# Patient Record
Sex: Male | Born: 1990 | Hispanic: No | Smoking: Current every day smoker
Health system: Southern US, Community
[De-identification: ages and names within clinical notes are randomized; demographics above are authoritative.]

## PROBLEM LIST (undated history)

## (undated) DIAGNOSIS — F1910 Other psychoactive substance abuse, uncomplicated: Secondary | ICD-10-CM

## (undated) DIAGNOSIS — B20 Human immunodeficiency virus [HIV] disease: Secondary | ICD-10-CM

## (undated) DIAGNOSIS — F419 Anxiety disorder, unspecified: Secondary | ICD-10-CM

## (undated) DIAGNOSIS — A529 Late syphilis, unspecified: Secondary | ICD-10-CM

## (undated) DIAGNOSIS — Z21 Asymptomatic human immunodeficiency virus [HIV] infection status: Secondary | ICD-10-CM

## (undated) DIAGNOSIS — F329 Major depressive disorder, single episode, unspecified: Secondary | ICD-10-CM

## (undated) DIAGNOSIS — T8859XA Other complications of anesthesia, initial encounter: Secondary | ICD-10-CM

## (undated) DIAGNOSIS — F32A Depression, unspecified: Secondary | ICD-10-CM

## (undated) DIAGNOSIS — T7840XA Allergy, unspecified, initial encounter: Secondary | ICD-10-CM

## (undated) HISTORY — DX: Human immunodeficiency virus (HIV) disease: B20

## (undated) HISTORY — DX: Allergy, unspecified, initial encounter: T78.40XA

## (undated) HISTORY — PX: WISDOM TOOTH EXTRACTION: SHX21

## (undated) HISTORY — DX: Asymptomatic human immunodeficiency virus (hiv) infection status: Z21

---
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
## 2011-01-03 ENCOUNTER — Emergency Department (HOSPITAL_COMMUNITY)
Admission: EM | Admit: 2011-01-03 | Discharge: 2011-01-03 | Disposition: A | Discharge status: Home / Self Care | Payer: Self-pay | Attending: Emergency Medicine | Admitting: Emergency Medicine

## 2011-01-03 ENCOUNTER — Emergency Department (HOSPITAL_COMMUNITY): Payer: Self-pay

## 2011-01-03 DIAGNOSIS — F411 Generalized anxiety disorder: Secondary | ICD-10-CM | POA: Insufficient documentation

## 2011-01-03 DIAGNOSIS — R042 Hemoptysis: Secondary | ICD-10-CM | POA: Insufficient documentation

## 2011-01-03 DIAGNOSIS — R079 Chest pain, unspecified: Secondary | ICD-10-CM | POA: Insufficient documentation

## 2011-01-07 ENCOUNTER — Emergency Department (HOSPITAL_COMMUNITY)
Admission: EM | Admit: 2011-01-07 | Discharge: 2011-01-07 | Disposition: A | Discharge status: Home / Self Care | Payer: Self-pay | Attending: Emergency Medicine | Admitting: Emergency Medicine

## 2011-01-07 ENCOUNTER — Emergency Department (HOSPITAL_COMMUNITY): Payer: Self-pay

## 2011-01-07 DIAGNOSIS — X500XXA Overexertion from strenuous movement or load, initial encounter: Secondary | ICD-10-CM | POA: Insufficient documentation

## 2011-01-07 DIAGNOSIS — S93409A Sprain of unspecified ligament of unspecified ankle, initial encounter: Secondary | ICD-10-CM | POA: Insufficient documentation

## 2011-01-07 DIAGNOSIS — R229 Localized swelling, mass and lump, unspecified: Secondary | ICD-10-CM | POA: Insufficient documentation

## 2011-01-15 ENCOUNTER — Emergency Department (HOSPITAL_COMMUNITY)
Admission: EM | Admit: 2011-01-15 | Discharge: 2011-01-15 | Disposition: A | Discharge status: Home / Self Care | Payer: Self-pay | Attending: Emergency Medicine | Admitting: Emergency Medicine

## 2011-01-15 DIAGNOSIS — M25579 Pain in unspecified ankle and joints of unspecified foot: Secondary | ICD-10-CM | POA: Insufficient documentation

## 2011-01-21 LAB — RAPID STREP SCREEN (MED CTR MEBANE ONLY): Streptococcus, Group A Screen (Direct): NEGATIVE

## 2011-01-21 LAB — MONONUCLEOSIS SCREEN: Mono Screen: NEGATIVE

## 2011-03-07 ENCOUNTER — Emergency Department (HOSPITAL_COMMUNITY): Payer: Self-pay

## 2011-03-07 ENCOUNTER — Encounter: Payer: Self-pay | Admitting: *Deleted

## 2011-03-07 ENCOUNTER — Emergency Department (HOSPITAL_COMMUNITY)
Admission: EM | Admit: 2011-03-07 | Discharge: 2011-03-07 | Disposition: A | Discharge status: Home / Self Care | Payer: Self-pay | Attending: Emergency Medicine | Admitting: Emergency Medicine

## 2011-03-07 DIAGNOSIS — K529 Noninfective gastroenteritis and colitis, unspecified: Secondary | ICD-10-CM

## 2011-03-07 DIAGNOSIS — J111 Influenza due to unidentified influenza virus with other respiratory manifestations: Secondary | ICD-10-CM | POA: Insufficient documentation

## 2011-03-07 DIAGNOSIS — J069 Acute upper respiratory infection, unspecified: Secondary | ICD-10-CM

## 2011-03-07 DIAGNOSIS — K5289 Other specified noninfective gastroenteritis and colitis: Secondary | ICD-10-CM | POA: Insufficient documentation

## 2011-03-07 LAB — DIFFERENTIAL
Basophils Absolute: 0 10*3/uL (ref 0.0–0.1)
Basophils Relative: 0 % (ref 0–1)
Eosinophils Absolute: 0 10*3/uL (ref 0.0–0.7)
Eosinophils Relative: 0 % (ref 0–5)
Lymphocytes Relative: 4 % — ABNORMAL LOW (ref 12–46)
Lymphs Abs: 1 10*3/uL (ref 0.7–4.0)
Monocytes Absolute: 1.9 10*3/uL — ABNORMAL HIGH (ref 0.1–1.0)
Monocytes Relative: 8 % (ref 3–12)
Neutro Abs: 20.7 10*3/uL — ABNORMAL HIGH (ref 1.7–7.7)
Neutrophils Relative %: 87 % — ABNORMAL HIGH (ref 43–77)

## 2011-03-07 LAB — CBC
HCT: 41.4 % (ref 39.0–52.0)
Hemoglobin: 14.2 g/dL (ref 13.0–17.0)
MCH: 29.8 pg (ref 26.0–34.0)
MCHC: 34.3 g/dL (ref 30.0–36.0)
MCV: 87 fL (ref 78.0–100.0)
Platelets: 273 10*3/uL (ref 150–400)
RBC: 4.76 MIL/uL (ref 4.22–5.81)
RDW: 13.8 % (ref 11.5–15.5)
WBC: 23.7 10*3/uL — ABNORMAL HIGH (ref 4.0–10.5)

## 2011-03-07 LAB — URINALYSIS, ROUTINE W REFLEX MICROSCOPIC
Bilirubin Urine: NEGATIVE
Glucose, UA: NEGATIVE mg/dL
Hgb urine dipstick: NEGATIVE
Ketones, ur: NEGATIVE mg/dL
Leukocytes, UA: NEGATIVE
Nitrite: NEGATIVE
Protein, ur: NEGATIVE mg/dL
Specific Gravity, Urine: 1.029 (ref 1.005–1.030)
Urobilinogen, UA: 0.2 mg/dL (ref 0.0–1.0)
pH: 5.5 (ref 5.0–8.0)

## 2011-03-07 LAB — COMPREHENSIVE METABOLIC PANEL
ALT: 64 U/L — ABNORMAL HIGH (ref 0–53)
AST: 38 U/L — ABNORMAL HIGH (ref 0–37)
Albumin: 4.2 g/dL (ref 3.5–5.2)
Alkaline Phosphatase: 54 U/L (ref 39–117)
BUN: 13 mg/dL (ref 6–23)
CO2: 22 mEq/L (ref 19–32)
Calcium: 10.6 mg/dL — ABNORMAL HIGH (ref 8.4–10.5)
Chloride: 99 mEq/L (ref 96–112)
Creatinine, Ser: 1 mg/dL (ref 0.50–1.35)
GFR calc Af Amer: >90 mL/min (ref >90)
GFR calc non Af Amer: >90 mL/min (ref >90)
Glucose, Bld: 133 mg/dL — ABNORMAL HIGH (ref 70–99)
Potassium: 3.5 mEq/L (ref 3.5–5.1)
Sodium: 135 mEq/L (ref 135–145)
Total Bilirubin: 0.6 mg/dL (ref 0.3–1.2)
Total Protein: 7.7 g/dL (ref 6.0–8.3)

## 2011-03-07 MED ORDER — SODIUM CHLORIDE 0.9 % IV SOLN
Freq: Once | INTRAVENOUS | Status: AC
  Administered 2011-03-07: 14:00:00 via INTRAVENOUS

## 2011-03-07 MED ORDER — KETOROLAC TROMETHAMINE 30 MG/ML IJ SOLN
30.0000 mg | Freq: Once | INTRAMUSCULAR | Status: AC
  Administered 2011-03-07: 30 mg via INTRAVENOUS
  Filled 2011-03-07: qty 1

## 2011-03-07 MED ORDER — ONDANSETRON HCL 4 MG/2ML IJ SOLN
4.0000 mg | Freq: Once | INTRAMUSCULAR | Status: AC
  Administered 2011-03-07: 4 mg via INTRAVENOUS
  Filled 2011-03-07: qty 2

## 2011-03-07 MED ORDER — HYDROCODONE-ACETAMINOPHEN 5-325 MG PO TABS: 1.0000 | ORAL_TABLET | Freq: Four times a day (QID) | ORAL | Status: AC | PRN

## 2011-03-07 MED ORDER — PROMETHAZINE HCL 25 MG RE SUPP: 25.0000 mg | Freq: Four times a day (QID) | RECTAL | Status: DC | PRN

## 2011-03-07 MED ORDER — PROMETHAZINE-DM 6.25-15 MG/5ML PO SYRP: 5.0000 mL | ORAL_SOLUTION | Freq: Four times a day (QID) | ORAL | Status: AC | PRN

## 2011-03-07 MED ORDER — SODIUM CHLORIDE 0.9 % IV BOLUS (SEPSIS)
1000.0000 mL | Freq: Once | INTRAVENOUS | Status: AC
  Administered 2011-03-07: 1000 mL via INTRAVENOUS

## 2011-03-07 MED ORDER — MORPHINE SULFATE 4 MG/ML IJ SOLN
6.0000 mg | Freq: Once | INTRAMUSCULAR | Status: AC
  Administered 2011-03-07: 6 mg via INTRAVENOUS
  Filled 2011-03-07 (×2): qty 1

## 2011-03-07 NOTE — ED Notes (Signed)
Pt. Given Ginger Ale for PO challenge per PA order. Additional fluids hung.

## 2011-03-07 NOTE — ED Notes (Signed)
Pt still unable to urinate at this time, pt and family was reminded of the need

## 2011-03-07 NOTE — ED Notes (Signed)
Pt is aware of the need for a urine sample, urinal provided at bedside

## 2011-03-07 NOTE — ED Provider Notes (Signed)
History     CSN: [id number] Arrival date & time: 03/07/2011 11:59 AM   First MD Initiated Contact with Patient 03/07/11 1232      Chief Complaint  Patient presents with  . Nausea  . Emesis    (Consider location/radiation/quality/duration/timing/severity/associated sxs/prior treatment) Patient is a 20 y.o. male presenting with vomiting. The history is provided by the patient.  Emesis    Patient presents with a one-day history of nausea, vomiting, body aches, nasal congestion, cough, and runny nose.  He denies chest pain, abdominal pain, shortness of breath, weakness, numbness, blurred vision.  Patient is unable to keep fluids and ibuprofen down.     History reviewed. No pertinent past surgical history.  History reviewed. No pertinent family history.  History  Substance Use Topics  . Smoking status: Never Smoker   . Smokeless tobacco: Never Used  . Alcohol Use: No      Review of Systems  Gastrointestinal: Positive for vomiting.  All other systems reviewed and are negative.    Allergies  Review of patient's allergies indicates no known allergies.  Home Medications   Current Outpatient Rx  Name Route Sig Dispense Refill  . CLONAZEPAM 1 MG PO TABS Oral Take 1 mg by mouth 2 (two) times daily as needed. For anxiety panic attack     . PAROXETINE HCL 20 MG PO TABS Oral Take 20 mg by mouth every morning.        BP 114/85  Pulse 115  Temp(Src) 100.4 F (38 C) (Oral)  Resp 18  SpO2 100%  Physical Exam  Constitutional: He is oriented to person, place, and time. He appears well-developed and well-nourished. He appears distressed.  HENT:  Head: Normocephalic and atraumatic.  Eyes: Pupils are equal, round, and reactive to light.  Neck: Normal range of motion. Neck supple.  Cardiovascular: Normal rate and regular rhythm.   Pulmonary/Chest: Effort normal and breath sounds normal.  Abdominal: Soft. Bowel sounds are normal. He exhibits no distension. There is  tenderness. There is no rebound and no guarding.  Neurological: He is alert and oriented to person, place, and time.  Skin: Skin is warm and dry. No rash noted.  Psychiatric: He has a normal mood and affect. His behavior is normal. Judgment and thought content normal.    ED Course  Procedures (including critical care time)  Labs Reviewed  CBC - Abnormal; Notable for the following:    WBC 23.7 (*)    All other components within normal limits  DIFFERENTIAL - Abnormal; Notable for the following:    Neutrophils Relative 87 (*)    Neutro Abs 20.7 (*)    Lymphocytes Relative 4 (*)    Monocytes Absolute 1.9 (*)    All other components within normal limits  URINALYSIS, ROUTINE W REFLEX MICROSCOPIC  COMPREHENSIVE METABOLIC PANEL      Results for orders placed during the hospital encounter of 03/07/11  URINALYSIS, ROUTINE W REFLEX MICROSCOPIC      Component Value Range   Color, Urine YELLOW  YELLOW    Appearance CLOUDY (*) CLEAR    Specific Gravity, Urine 1.029  1.005 - 1.030    pH 5.5  5.0 - 8.0    Glucose, UA NEGATIVE  NEGATIVE (mg/dL)   Hgb urine dipstick NEGATIVE  NEGATIVE    Bilirubin Urine NEGATIVE  NEGATIVE    Ketones, ur NEGATIVE  NEGATIVE (mg/dL)   Protein, ur NEGATIVE  NEGATIVE (mg/dL)   Urobilinogen, UA 0.2  0.0 - 1.0 (mg/dL)  Nitrite NEGATIVE  NEGATIVE    Leukocytes, UA NEGATIVE  NEGATIVE   CBC      Component Value Range   WBC 23.7 (*) 4.0 - 10.5 (K/uL)   RBC 4.76  4.22 - 5.81 (MIL/uL)   Hemoglobin 14.2  13.0 - 17.0 (g/dL)   HCT 16.1  09.6 - 04.5 (%)   MCV 87.0  78.0 - 100.0 (fL)   MCH 29.8  26.0 - 34.0 (pg)   MCHC 34.3  30.0 - 36.0 (g/dL)   RDW 40.9  81.1 - 91.4 (%)   Platelets 273  150 - 400 (K/uL)  DIFFERENTIAL      Component Value Range   Neutrophils Relative 87 (*) 43 - 77 (%)   Neutro Abs 20.7 (*) 1.7 - 7.7 (K/uL)   Lymphocytes Relative 4 (*) 12 - 46 (%)   Lymphs Abs 1.0  0.7 - 4.0 (K/uL)   Monocytes Relative 8  3 - 12 (%)   Monocytes Absolute 1.9  (*) 0.1 - 1.0 (K/uL)   Eosinophils Relative 0  0 - 5 (%)   Eosinophils Absolute 0.0  0.0 - 0.7 (K/uL)   Basophils Relative 0  0 - 1 (%)   Basophils Absolute 0.0  0.0 - 0.1 (K/uL)  COMPREHENSIVE METABOLIC PANEL      Component Value Range   Sodium 135  135 - 145 (mEq/L)   Potassium 3.5  3.5 - 5.1 (mEq/L)   Chloride 99  96 - 112 (mEq/L)   CO2 22  19 - 32 (mEq/L)   Glucose, Bld 133 (*) 70 - 99 (mg/dL)   BUN 13  6 - 23 (mg/dL)   Creatinine, Ser 7.82  0.50 - 1.35 (mg/dL)   Calcium 95.6 (*) 8.4 - 10.5 (mg/dL)   Total Protein 7.7  6.0 - 8.3 (g/dL)   Albumin 4.2  3.5 - 5.2 (g/dL)   AST 38 (*) 0 - 37 (U/L)   ALT 64 (*) 0 - 53 (U/L)   Alkaline Phosphatase 54  39 - 117 (U/L)   Total Bilirubin 0.6  0.3 - 1.2 (mg/dL)   GFR calc non Af Amer >90  >90 (mL/min)   GFR calc Af Amer >90  >90 (mL/min)    Patient most likely has a viral illness.  He has been stable while here in the emergency department.  He has gotten 3 L of fluids and medications and is feeling dramatically better.  We will have him recheck with his primary care Dr. for followup.  Told to return here for any worsening in his condition.  MDM  Patient will be treated for a viral flulike illness. He is told to increase his fluids.  Rest as much as well as.  Patient is dramatically improved and states that he has feeling much better.         Carlyle Dolly, [place] 03/07/11 1807

## 2011-03-07 NOTE — ED Notes (Signed)
Pt states he is having n/v/s. Pt also c/o chillis and fever. Pt states he is having body aches

## 2011-03-07 NOTE — ED Notes (Signed)
Pt again reminded of the need for urine.

## 2011-03-07 NOTE — ED Notes (Signed)
Patient transported to X-ray 

## 2011-03-09 NOTE — ED Provider Notes (Signed)
Medical screening examination/treatment/procedure(s) were performed by non-physician practitioner and as supervising physician I was immediately available for consultation/collaboration.  Toy Baker, MD 03/09/11 [phone]

## 2012-09-02 ENCOUNTER — Emergency Department (HOSPITAL_COMMUNITY)
Admission: EM | Admit: 2012-09-02 | Discharge: 2012-09-02 | Disposition: A | Discharge status: Home / Self Care | Payer: Self-pay | Attending: Emergency Medicine | Admitting: Emergency Medicine

## 2012-09-02 ENCOUNTER — Encounter (HOSPITAL_COMMUNITY): Payer: Self-pay

## 2012-09-02 DIAGNOSIS — Z79899 Other long term (current) drug therapy: Secondary | ICD-10-CM | POA: Insufficient documentation

## 2012-09-02 DIAGNOSIS — L089 Local infection of the skin and subcutaneous tissue, unspecified: Secondary | ICD-10-CM

## 2012-09-02 DIAGNOSIS — L723 Sebaceous cyst: Secondary | ICD-10-CM | POA: Insufficient documentation

## 2012-09-02 MED ORDER — OXYCODONE-ACETAMINOPHEN 5-325 MG PO TABS
2.0000 | ORAL_TABLET | Freq: Once | ORAL | Status: AC
  Administered 2012-09-02: 2 via ORAL
  Filled 2012-09-02: qty 2

## 2012-09-02 MED ORDER — IBUPROFEN 200 MG PO TABS
600.0000 mg | ORAL_TABLET | Freq: Once | ORAL | Status: AC
  Administered 2012-09-02: 600 mg via ORAL
  Filled 2012-09-02: qty 3

## 2012-09-02 MED ORDER — LORAZEPAM 1 MG PO TABS
1.0000 mg | ORAL_TABLET | Freq: Once | ORAL | Status: AC
  Administered 2012-09-02: 1 mg via ORAL
  Filled 2012-09-02: qty 1

## 2012-09-02 MED ORDER — TRAMADOL HCL 50 MG PO TABS: 50.0000 mg | ORAL_TABLET | Freq: Four times a day (QID) | ORAL | Status: DC | PRN

## 2012-09-02 MED ORDER — LIDOCAINE-EPINEPHRINE 2 %-1:100000 IJ SOLN: 10.0000 mL | Freq: Once | INTRAMUSCULAR | Status: DC

## 2012-09-02 NOTE — ED Notes (Signed)
Pt presents with an abscess on his lower abdominal area right above his pubic bone that started out as knot about a year ago and has just gotten progressively worse.

## 2012-09-02 NOTE — ED Provider Notes (Addendum)
History    22 year old male with a "knot" in his left lower abodmen. Patient states that this is been there for a period of several years. Increasingly enlarged in size and painful over the past several days. Denies any acute trauma. No surrounding rash. No fevers or chills. No drainage. No urinary complaints. No nausea or vomiting.  CSN: [id number]  Arrival date & time 09/02/12  1222   First MD Initiated Contact with Patient 09/02/12 1234      Chief Complaint  Patient presents with  . Abscess    (Consider location/radiation/quality/duration/timing/severity/associated sxs/prior treatment) HPI  History reviewed. No pertinent past medical history.  Past Surgical History  Procedure Laterality Date  . Wisdom tooth extraction      No family history on file.  History  Substance Use Topics  . Smoking status: Never Smoker   . Smokeless tobacco: Never Used  . Alcohol Use: No      Review of Systems  All systems reviewed and negative, other than as noted in HPI.   Allergies  Review of patient's allergies indicates no known allergies.  Home Medications   Current Outpatient Rx  Name  Route  Sig  Dispense  Refill  . clonazePAM (KLONOPIN) 1 MG tablet   Oral   Take 1 mg by mouth 2 (two) times daily as needed. For anxiety panic attack          . ibuprofen (ADVIL,MOTRIN) 200 MG tablet   Oral   Take 400 mg by mouth every 6 (six) hours as needed for pain.         [name] PARoxetine (PAXIL) 20 MG tablet   Oral   Take 20 mg by mouth every morning.             BP 154/72  Pulse 103  Temp(Src) 98.7 F (37.1 C) (Oral)  Resp 18  SpO2 100%  Physical Exam  Nursing note and vitals reviewed. Constitutional: He appears well-developed and well-nourished. No distress.  HENT:  Head: Normocephalic and atraumatic.  Eyes: Conjunctivae are normal. Right eye exhibits no discharge. Left eye exhibits no discharge.  Neck: Neck supple.  Cardiovascular: Normal rate, regular rhythm and  normal heart sounds.  Exam reveals no gallop and no friction rub.   No murmur heard. Pulmonary/Chest: Effort normal and breath sounds normal. No respiratory distress.  Abdominal: Soft. He exhibits no distension. There is no tenderness.  Musculoskeletal: He exhibits no edema and no tenderness.  Neurological: He is alert.  Skin: Skin is warm and dry.  Golf ball sized fluctuance lesion L mons pubis near belt line. Tender to touch. No surrounding induration/cellultis.   Psychiatric: He has a normal mood and affect. His behavior is normal. Thought content normal.    ED Course  Procedures (including critical care time)   INCISION AND DRAINAGE Performed by: Raeford Razor Consent: Verbal consent obtained. Risks and benefits: risks, benefits and alternatives were discussed Type: abscess  Body area: Mons pubis  Anesthesia: local infiltration  Incision was made with a scalpel.  Local anesthetic: lidocain2% w epinephrine  Anesthetic total: 3 ml  Complexity: complex Blunt dissection to break up loculations  Drainage: thick curd like and foul smelling  Drainage amount: large   Patient tolerance: Patient tolerated the procedure well with no immediate complications.   Labs Reviewed - No data to display No results found.   1. Infected sebaceous cyst       MDM  22yM with infected sebaceous cyst. Removed. Continued wound care. Return precautions  discussed.         Raeford Razor, MD 09/04/12 1424  Raeford Razor, MD 09/04/12 [phone]

## 2013-01-09 ENCOUNTER — Emergency Department (HOSPITAL_COMMUNITY)
Admission: EM | Admit: 2013-01-09 | Discharge: 2013-01-09 | Disposition: A | Discharge status: Home / Self Care | Payer: Self-pay | Attending: Emergency Medicine | Admitting: Emergency Medicine

## 2013-01-09 ENCOUNTER — Encounter (HOSPITAL_COMMUNITY): Payer: Self-pay | Admitting: Emergency Medicine

## 2013-01-09 DIAGNOSIS — Z79899 Other long term (current) drug therapy: Secondary | ICD-10-CM | POA: Insufficient documentation

## 2013-01-09 DIAGNOSIS — N499 Inflammatory disorder of unspecified male genital organ: Secondary | ICD-10-CM

## 2013-01-09 DIAGNOSIS — Z23 Encounter for immunization: Secondary | ICD-10-CM | POA: Insufficient documentation

## 2013-01-09 DIAGNOSIS — N498 Inflammatory disorders of other specified male genital organs: Secondary | ICD-10-CM | POA: Insufficient documentation

## 2013-01-09 MED ORDER — HYDROCODONE-ACETAMINOPHEN 5-325 MG PO TABS: 2.0000 | ORAL_TABLET | ORAL | Status: DC | PRN

## 2013-01-09 MED ORDER — TETANUS-DIPHTH-ACELL PERTUSSIS 5-2.5-18.5 LF-MCG/0.5 IM SUSP
0.5000 mL | Freq: Once | INTRAMUSCULAR | Status: AC
  Administered 2013-01-09: 0.5 mL via INTRAMUSCULAR
  Filled 2013-01-09: qty 0.5

## 2013-01-09 MED ORDER — CEPHALEXIN 500 MG PO CAPS: 500.0000 mg | ORAL_CAPSULE | Freq: Two times a day (BID) | ORAL | Status: AC

## 2013-01-09 MED ORDER — OXYCODONE-ACETAMINOPHEN 5-325 MG PO TABS
2.0000 | ORAL_TABLET | Freq: Once | ORAL | Status: AC
  Administered 2013-01-09: 2 via ORAL
  Filled 2013-01-09: qty 2

## 2013-01-09 MED ORDER — SULFAMETHOXAZOLE-TRIMETHOPRIM 800-160 MG PO TABS: 1.0000 | ORAL_TABLET | Freq: Two times a day (BID) | ORAL | Status: DC

## 2013-01-09 NOTE — ED Notes (Signed)
Pt states that he has had a cyst on his left testicle for 1 year.  States that he has already been seen for this.  Started hurting again Friday morning.

## 2013-01-09 NOTE — Progress Notes (Signed)
[facility] provided pt with a list of primary care resources.  °

## 2013-01-09 NOTE — ED Provider Notes (Signed)
CSN: [id number]     Arrival date & time 01/09/13  0940 History   First MD Initiated Contact with Patient 01/09/13 1029     Chief Complaint  Patient presents with  . Abscess    HPI  Jeremy Gallagher is a 22 y.o. male with a no PMH who presents to the ED for evaluation of an abscess. History was provided by the patient and mother.  Patient states that he has had a sebaceous cyst on his left testicle for several months. He was told that there is nothing to be done about this cyst however he was told if he develops any redness or severe pain he should be seen. Patient states for about a week he has had increased redness and swelling to this cyst. He states that this cyst has a "head" now at the tip of the cyst. He denies any wounds, drainage, bleeding or trauma.  He denies any testicular pain, penile pain, penile discharge, dysuria, hematuria, fever, abdominal pain, nausea, vomiting, or genital sores.  He states that he had an abscess on his left suprapubic region which is drained several months ago. He denies any history of other abscesses. He is a nondiabetic. He states he regularly gets tested for STI's every 6 months and has not had any new sexual partners.  He otherwise has been well with no rhinorrhea, congestion, chest pain, shortness of breath, constipation, diarrhea, headache, dizziness, or lightheadedness. He does not believe his tetanus is up-to-date.  Patient states that he has a severe anxiety disorder. He states that he is going to "need something to take the edge off".    History reviewed. No pertinent past medical history. Past Surgical History  Procedure Laterality Date  . Wisdom tooth extraction     No family history on file. History  Substance Use Topics  . Smoking status: Never Smoker   . Smokeless tobacco: Never Used  . Alcohol Use: No    Review of Systems  Constitutional: Negative for fever, chills, activity change, appetite change and fatigue.  HENT: Negative for  congestion, sore throat, rhinorrhea, neck pain and neck stiffness.   Eyes: Negative for photophobia and visual disturbance.  Respiratory: Negative for cough and shortness of breath.   Cardiovascular: Negative for chest pain.  Gastrointestinal: Negative for nausea, vomiting, abdominal pain, diarrhea and constipation.  Genitourinary: Negative for dysuria, urgency, hematuria, flank pain, decreased urine volume, discharge, penile swelling, scrotal swelling, difficulty urinating, genital sores, penile pain and testicular pain.  Musculoskeletal: Negative for back pain and gait problem.  Skin: Negative for wound.  Neurological: Negative for dizziness, weakness, light-headedness and headaches.    Allergies  Review of patient's allergies indicates no known allergies.  Home Medications   Current Outpatient Rx  Name  Route  Sig  Dispense  Refill  . amphetamine-dextroamphetamine (ADDERALL) 30 MG tablet   Oral   Take 30 mg by mouth 2 (two) times daily.         . clonazePAM (KLONOPIN) 1 MG tablet   Oral   Take 1 mg by mouth 2 (two) times daily as needed. For anxiety panic attack          . PARoxetine (PAXIL) 20 MG tablet   Oral   Take 20 mg by mouth every morning.           . traZODone (DESYREL) 100 MG tablet   Oral   Take 100 mg by mouth at bedtime.  BP 131/71  Pulse 91  Temp(Src) 99.1 F (37.3 C) (Oral)  Resp 16  SpO2 100%  Filed Vitals:   01/09/13 1000  BP: 131/71  Pulse: 91  Temp: 99.1 F (37.3 C)  TempSrc: Oral  Resp: 16  SpO2: 100%    Physical Exam  Nursing note and vitals reviewed. Constitutional: He is oriented to person, place, and time. He appears well-developed and well-nourished. No distress.  HENT:  Head: Normocephalic and atraumatic.  Right Ear: External ear normal.  Left Ear: External ear normal.  Nose: Nose normal.  Mouth/Throat: Oropharynx is clear and moist. No oropharyngeal exudate.  Eyes: Conjunctivae are normal. Pupils are equal,  round, and reactive to light. Right eye exhibits no discharge. Left eye exhibits no discharge.  Neck: Normal range of motion. Neck supple.  Cardiovascular: Normal rate, regular rhythm, normal heart sounds and intact distal pulses.  Exam reveals no gallop and no friction rub.   No murmur heard. Pulmonary/Chest: Effort normal and breath sounds normal. No respiratory distress. He has no wheezes. He has no rales. He exhibits no tenderness.  Abdominal: Soft. Bowel sounds are normal. He exhibits no distension and no mass. There is no tenderness. There is no rebound and no guarding.  Genitourinary: Penis normal.        1 cm x 1 cm firm circular mobile mass to the underside of the left scrotum with an overlying area of fluctuance and erythema.  No drainage, wounds or bleeding.  1 cm x 1 cm circular firm mass on the right anterior scrotum with no overlying erythema or fluctuance.    Musculoskeletal: Normal range of motion. He exhibits no edema and no tenderness.  Neurological: He is alert and oriented to person, place, and time.  Skin: Skin is warm and dry. He is not diaphoretic.    ED Course  INCISION AND DRAINAGE Date/Time: 01/09/2013 12:30 PM Performed by: Coral Ceo K Authorized by: Jillyn Ledger Consent: Verbal consent obtained. Risks and benefits: risks, benefits and alternatives were discussed Consent given by: patient Patient identity confirmed: verbally with patient Type: abscess Body area: anogenital Location details: scrotal wall Anesthesia: local infiltration Local anesthetic: lidocaine 1% without epinephrine Anesthetic total: 3 ml Patient sedated: no Scalpel size: 11 Incision type: single straight Complexity: simple Drainage: purulent Drainage amount: scant Packing material: wick placed Patient tolerance: Patient tolerated the procedure well with no immediate complications.   (including critical care time) Labs Review Labs Reviewed - No data to  display Imaging Review No results found.  MDM  No diagnosis found.  Jeremy Gallagher is a 22 y.o. male with a no PMH who presents to the ED for evaluation of an abscess.  Percocet ordered for pain.  Tetanus booster given.     Rechecks  12:30 PM = Abscess drained at bedside with RN staff present.     Abscess was drained in the ED without any complications or difficulty.  Packing material placed.  Patient instructed to have packing material removed and wound check in two days.  Patient provided with resource guide.  He was also also encouraged to follow-up with STI testing.  He was also given referral to general surgery for further management of cyst removal.  He was instructed to keep the wound clean and dry.  He was instructed to seek medical attention if he develops a fever, severe pain, spreading redness/swelling, or increased drainage.  Patient was prescribed Keflex, bactrim and Norco for outpatient management.  Patient was in agreement with  discharge and plan.  Patient's mom in the ED and will drive him home.     Final impressions: 1. Abscess, left scrotum     Greer Ee Annaliyah Willig PA-C          Jillyn Ledger, PA-C 01/11/13 [phone]

## 2013-01-12 NOTE — ED Provider Notes (Signed)
Medical screening examination/treatment/procedure(s) were performed by non-physician practitioner and as supervising physician I was immediately available for consultation/collaboration. Devoria Albe, MD, Armando Gang   Ward Givens, MD 01/12/13 [phone]

## 2013-10-19 ENCOUNTER — Encounter (HOSPITAL_COMMUNITY): Payer: Self-pay | Admitting: Emergency Medicine

## 2013-10-19 ENCOUNTER — Emergency Department (HOSPITAL_COMMUNITY)
Admission: EM | Admit: 2013-10-19 | Discharge: 2013-10-19 | Disposition: A | Discharge status: Home / Self Care | Payer: Self-pay | Attending: Emergency Medicine | Admitting: Emergency Medicine

## 2013-10-19 DIAGNOSIS — Z23 Encounter for immunization: Secondary | ICD-10-CM | POA: Insufficient documentation

## 2013-10-19 DIAGNOSIS — Y92009 Unspecified place in unspecified non-institutional (private) residence as the place of occurrence of the external cause: Secondary | ICD-10-CM | POA: Insufficient documentation

## 2013-10-19 DIAGNOSIS — S51809A Unspecified open wound of unspecified forearm, initial encounter: Secondary | ICD-10-CM | POA: Insufficient documentation

## 2013-10-19 DIAGNOSIS — Z79899 Other long term (current) drug therapy: Secondary | ICD-10-CM | POA: Insufficient documentation

## 2013-10-19 DIAGNOSIS — S51009A Unspecified open wound of unspecified elbow, initial encounter: Secondary | ICD-10-CM | POA: Insufficient documentation

## 2013-10-19 DIAGNOSIS — W268XXA Contact with other sharp object(s), not elsewhere classified, initial encounter: Secondary | ICD-10-CM | POA: Insufficient documentation

## 2013-10-19 DIAGNOSIS — S41111A Laceration without foreign body of right upper arm, initial encounter: Secondary | ICD-10-CM

## 2013-10-19 DIAGNOSIS — Y9389 Activity, other specified: Secondary | ICD-10-CM | POA: Insufficient documentation

## 2013-10-19 MED ORDER — LORAZEPAM 2 MG/ML IJ SOLN
1.0000 mg | Freq: Once | INTRAMUSCULAR | Status: AC
  Administered 2013-10-19: 1 mg via INTRAMUSCULAR
  Filled 2013-10-19: qty 1

## 2013-10-19 MED ORDER — TETANUS-DIPHTH-ACELL PERTUSSIS 5-2.5-18.5 LF-MCG/0.5 IM SUSP
0.5000 mL | Freq: Once | INTRAMUSCULAR | Status: AC
  Administered 2013-10-19: 0.5 mL via INTRAMUSCULAR
  Filled 2013-10-19: qty 0.5

## 2013-10-19 NOTE — ED Provider Notes (Signed)
Medical screening examination/treatment/procedure(s) were performed by non-physician practitioner and as supervising physician I was immediately available for consultation/collaboration.   EKG Interpretation None       Bradin Mcadory, MD 10/19/13 2308 

## 2013-10-19 NOTE — Discharge Instructions (Signed)
Ibuprofen or tylenol for pain. Keep arm covered. Follow up in 10 days for suture removal.    Laceration Care, Adult A laceration is a cut or lesion that goes through all layers of the skin and into the tissue just beneath the skin. TREATMENT  Some lacerations may not require closure. Some lacerations may not be able to be closed due to an increased risk of infection. It is important to see your caregiver as soon as possible after an injury to minimize the risk of infection and maximize the opportunity for successful closure. If closure is appropriate, pain medicines may be given, if needed. The wound will be cleaned to help prevent infection. Your caregiver will use stitches (sutures), staples, wound glue (adhesive), or skin adhesive strips to repair the laceration. These tools bring the skin edges together to allow for faster healing and a better cosmetic outcome. However, all wounds will heal with a scar. Once the wound has healed, scarring can be minimized by covering the wound with sunscreen during the day for 1 full year. HOME CARE INSTRUCTIONS  For sutures or staples:  Keep the wound clean and dry.  If you were given a bandage (dressing), you should change it at least once a day. Also, change the dressing if it becomes wet or dirty, or as directed by your caregiver.  Wash the wound with soap and water 2 times a day. Rinse the wound off with water to remove all soap. Pat the wound dry with a clean towel.  After cleaning, apply a thin layer of the antibiotic ointment as recommended by your caregiver. This will help prevent infection and keep the dressing from sticking.  You may shower as usual after the first 24 hours. Do not soak the wound in water until the sutures are removed.  Only take over-the-counter or prescription medicines for pain, discomfort, or fever as directed by your caregiver.  Get your sutures or staples removed as directed by your caregiver. For skin adhesive  strips:  Keep the wound clean and dry.  Do not get the skin adhesive strips wet. You may bathe carefully, using caution to keep the wound dry.  If the wound gets wet, pat it dry with a clean towel.  Skin adhesive strips will fall off on their own. You may trim the strips as the wound heals. Do not remove skin adhesive strips that are still stuck to the wound. They will fall off in time. For wound adhesive:  You may briefly wet your wound in the shower or bath. Do not soak or scrub the wound. Do not swim. Avoid periods of heavy perspiration until the skin adhesive has fallen off on its own. After showering or bathing, gently pat the wound dry with a clean towel.  Do not apply liquid medicine, cream medicine, or ointment medicine to your wound while the skin adhesive is in place. This may loosen the film before your wound is healed.  If a dressing is placed over the wound, be careful not to apply tape directly over the skin adhesive. This may cause the adhesive to be pulled off before the wound is healed.  Avoid prolonged exposure to sunlight or tanning lamps while the skin adhesive is in place. Exposure to ultraviolet light in the first year will darken the scar.  The skin adhesive will usually remain in place for 5 to 10 days, then naturally fall off the skin. Do not pick at the adhesive film. You may need a tetanus shot  if:  You cannot remember when you had your last tetanus shot.  You have never had a tetanus shot. If you get a tetanus shot, your arm may swell, get red, and feel warm to the touch. This is common and not a problem. If you need a tetanus shot and you choose not to have one, there is a rare chance of getting tetanus. Sickness from tetanus can be serious. SEEK MEDICAL CARE IF:   You have redness, swelling, or increasing pain in the wound.  You see a red line that goes away from the wound.  You have yellowish-white fluid (pus) coming from the wound.  You have a  fever.  You notice a bad smell coming from the wound or dressing.  Your wound breaks open before or after sutures have been removed.  You notice something coming out of the wound such as wood or glass.  Your wound is on your hand or foot and you cannot move a finger or toe. SEEK IMMEDIATE MEDICAL CARE IF:   Your pain is not controlled with prescribed medicine.  You have severe swelling around the wound causing pain and numbness or a change in color in your arm, hand, leg, or foot.  Your wound splits open and starts bleeding.  You have worsening numbness, weakness, or loss of function of any joint around or beyond the wound.  You develop painful lumps near the wound or on the skin anywhere on your body. MAKE SURE YOU:   Understand these instructions.  Will watch your condition.  Will get help right away if you are not doing well or get worse. Document Released: 04/04/2005 Document Revised: 06/27/2011 Document Reviewed: 09/28/2010 [facility] Patient Information 2015 [place], [place]. This information is not intended to replace advice given to you by your health care provider. Make sure you discuss any questions you have with your health care provider.

## 2013-10-19 NOTE — ED Notes (Signed)
Pt states he was pushing his glass door open and he cut his right arm open, pt is so upset hard to understand

## 2013-10-19 NOTE — ED Provider Notes (Signed)
CSN: [id number]     Arrival date & time 10/19/13  0142 History   First MD Initiated Contact with Patient 10/19/13 0155     Chief Complaint  Patient presents with  . Extremity Laceration     (Consider location/radiation/quality/duration/timing/severity/associated sxs/prior Treatment) HPI Jeremy Gallagher is a 23 y.o. male who presents emergency department with complaint of lacerations. Patient states he went to open his porch door and states his arm went through the glass, breaking the glass. Patient with several superficial lacerations to the right forearm. States palpation of the cuts makes pain worse, nothing makes it better. Patient's tetanus is unknown. He denies any numbness or weakness distal to the lacerations. He has no other complaints. No medications taken prior to arrival.  No past medical history on file. Past Surgical History  Procedure Laterality Date  . Wisdom tooth extraction     History reviewed. No pertinent family history. History  Substance Use Topics  . Smoking status: Never Smoker   . Smokeless tobacco: Never Used  . Alcohol Use: No    Review of Systems  Constitutional: Negative for fever and chills.  Respiratory: Negative for cough, chest tightness and shortness of breath.   Cardiovascular: Negative for chest pain, palpitations and leg swelling.  Genitourinary: Negative for dysuria, urgency, frequency and hematuria.  Musculoskeletal: Negative for arthralgias, myalgias, neck pain and neck stiffness.  Skin: Positive for wound.  Allergic/Immunologic: Negative for immunocompromised state.  Neurological: Negative for dizziness, weakness, light-headedness, numbness and headaches.      Allergies  Review of patient's allergies indicates no known allergies.  Home Medications   Prior to Admission medications   Medication Sig Start Date End Date Taking? Authorizing Provider  amphetamine-dextroamphetamine (ADDERALL) 30 MG tablet Take 30 mg by mouth 2 (two)  times daily.   Yes Historical Provider, MD  clonazePAM (KLONOPIN) 1 MG tablet Take 1 mg by mouth 2 (two) times daily as needed. For anxiety panic attack    Yes Historical Provider, MD  PARoxetine (PAXIL) 20 MG tablet Take 20 mg by mouth every morning.     Yes Historical Provider, MD  traZODone (DESYREL) 100 MG tablet Take 100 mg by mouth at bedtime.   Yes Historical Provider, MD   BP 144/88  Pulse 116  Temp(Src) 97.8 F (36.6 C) (Oral)  Resp 22  SpO2 99% Physical Exam  Nursing note and vitals reviewed. Constitutional: He is oriented to person, place, and time. He appears well-developed and well-nourished.  Patient is crying  HENT:  Head: Normocephalic.  Eyes: Conjunctivae are normal.  Cardiovascular: Normal rate and normal heart sounds.   No murmur heard. Pulmonary/Chest: Effort normal and breath sounds normal. No respiratory distress. He has no wheezes. He has no rales.  Musculoskeletal:  Full range of motion of the elbow, wrist, all fingers. Grip strength is intact bilaterally. Capillary refill less than 2 seconds distally. Sensation intact to the hand  Neurological: He is alert and oriented to person, place, and time.  Skin: Skin is warm and dry.  Superficial lacerations to the right forearm. 1 cm to the elbow, measuring 4 cm, 2 lacerations to the posterior forearm one measuring 2 cm, one measuring 4 cm, laceration to the anterior forearm measuring 2 cm. All hemostatic.    ED Course  Procedures (including critical care time) Labs Review Labs Reviewed - No data to display  Imaging Review No results found.   EKG Interpretation None      LACERATION REPAIR Performed by: Lottie MusselKIRICHENKO, Malayja Freund A  Authorized by: Jaynie CrumbleKIRICHENKO, Sahej Hauswirth A Consent: Verbal consent obtained. Risks and benefits: risks, benefits and alternatives were discussed Consent given by: patient Patient identity confirmed: provided demographic data Prepped and Draped in normal sterile fashion Wound  explored  Laceration Location: right elbow  Laceration Length: 4cm  No Foreign Bodies seen or palpated  Anesthesia: local infiltration  Local anesthetic: lidocaine 2% w epinephrine  Anesthetic total: 2 ml  Irrigation method: syringe Amount of cleaning: standard  Skin closure: prolene 5.0  Number of sutures: 4  Technique: simple interrupted  Patient tolerance: Patient tolerated the procedure well with no immediate complications.  LACERATION REPAIR Performed by: Lottie MusselKIRICHENKO, Kerigan Narvaez A Authorized by: Jaynie CrumbleKIRICHENKO, Briyah Wheelwright A Consent: Verbal consent obtained. Risks and benefits: risks, benefits and alternatives were discussed Consent given by: patient Patient identity confirmed: provided demographic data Prepped and Draped in normal sterile fashion Wound explored  Laceration Location: right posterior forearm  Laceration Length: 4cm  No Foreign Bodies seen or palpated  Anesthesia: local infiltration  Local anesthetic: lidocaine 2% w epinephrine  Anesthetic total: 2 ml  Irrigation method: syringe Amount of cleaning: standard  Skin closure: prolene 5.0  Number of sutures: 4  Technique: simple interrupted  Patient tolerance: Patient tolerated the procedure well with no immediate complications.  LACERATION REPAIR Performed by: Lottie MusselKIRICHENKO, Perle Brickhouse A Authorized by: Jaynie CrumbleKIRICHENKO, Joplin Canty A Consent: Verbal consent obtained. Risks and benefits: risks, benefits and alternatives were discussed Consent given by: patient Patient identity confirmed: provided demographic data Prepped and Draped in normal sterile fashion Wound explored  Laceration Location: right forearm  Laceration Length: 3cm  No Foreign Bodies seen or palpated  Anesthesia: local infiltration  Local anesthetic: lidocaine 2% w epinephrine  Anesthetic total: 2 ml  Irrigation method: syringe Amount of cleaning: standard  Skin closure: prolene 5.0  Number of sutures: 2  Technique: simple  interrupted  Patient tolerance: Patient tolerated the procedure well with no immediate complications.  LACERATION REPAIR Performed by: Lottie MusselKIRICHENKO, Christopherjame Carnell A Authorized by: Jaynie CrumbleKIRICHENKO, Amari Zagal A Consent: Verbal consent obtained. Risks and benefits: risks, benefits and alternatives were discussed Consent given by: patient Patient identity confirmed: provided demographic data Prepped and Draped in normal sterile fashion Wound explored  Laceration Location: right forearm  Laceration Length: 2cm  No Foreign Bodies seen or palpated  Anesthesia: local infiltration  Local anesthetic: lidocaine 2% w epinephrine  Anesthetic total: 2 ml  Irrigation method: syringe Amount of cleaning: standard  Skin closure: prolene 5.0  Number of sutures: 4  Technique: flap attachment simple interrupted  Patient tolerance: Patient tolerated the procedure well with no immediate complications.   MDM   Final diagnoses:  Lacerations of multiple sites of right arm, initial encounter    Patient is a lacerations to the right forearm and right elbow. Neurovascularly intact. Tetanus updated. The laceration repaired. I did not feel her palpated any foreign bodies however explained that cannot rule out small pieces of glass retained in the wounds. Dressing applied. We'll discharge home with ibuprofen and Tylenol for pain, followup in 7-10 days for suture removal. Precautions discussed to return if any signs of infection or worsening symptoms.  Filed Vitals:   10/19/13 0143  BP: 144/88  Pulse: 116  Temp: 97.8 F (36.6 C)  TempSrc: Oral  Resp: 22  SpO2: 99%       Myna Freimark A Aras Albarran, PA-C 10/19/13 0401

## 2014-03-06 ENCOUNTER — Emergency Department (HOSPITAL_COMMUNITY): Payer: Self-pay

## 2014-03-06 ENCOUNTER — Encounter (HOSPITAL_COMMUNITY): Payer: Self-pay | Admitting: *Deleted

## 2014-03-06 ENCOUNTER — Emergency Department (HOSPITAL_COMMUNITY)
Admission: EM | Admit: 2014-03-06 | Discharge: 2014-03-06 | Disposition: A | Discharge status: Home / Self Care | Payer: Self-pay | Attending: Emergency Medicine | Admitting: Emergency Medicine

## 2014-03-06 DIAGNOSIS — T1490XA Injury, unspecified, initial encounter: Secondary | ICD-10-CM

## 2014-03-06 DIAGNOSIS — W19XXXA Unspecified fall, initial encounter: Secondary | ICD-10-CM

## 2014-03-06 DIAGNOSIS — Y9289 Other specified places as the place of occurrence of the external cause: Secondary | ICD-10-CM | POA: Insufficient documentation

## 2014-03-06 DIAGNOSIS — S40011A Contusion of right shoulder, initial encounter: Secondary | ICD-10-CM | POA: Insufficient documentation

## 2014-03-06 DIAGNOSIS — W010XXA Fall on same level from slipping, tripping and stumbling without subsequent striking against object, initial encounter: Secondary | ICD-10-CM | POA: Insufficient documentation

## 2014-03-06 DIAGNOSIS — S46911A Strain of unspecified muscle, fascia and tendon at shoulder and upper arm level, right arm, initial encounter: Secondary | ICD-10-CM | POA: Insufficient documentation

## 2014-03-06 DIAGNOSIS — Z79899 Other long term (current) drug therapy: Secondary | ICD-10-CM | POA: Insufficient documentation

## 2014-03-06 DIAGNOSIS — F329 Major depressive disorder, single episode, unspecified: Secondary | ICD-10-CM | POA: Insufficient documentation

## 2014-03-06 DIAGNOSIS — Y9389 Activity, other specified: Secondary | ICD-10-CM | POA: Insufficient documentation

## 2014-03-06 DIAGNOSIS — Y998 Other external cause status: Secondary | ICD-10-CM | POA: Insufficient documentation

## 2014-03-06 DIAGNOSIS — F419 Anxiety disorder, unspecified: Secondary | ICD-10-CM | POA: Insufficient documentation

## 2014-03-06 HISTORY — DX: Anxiety disorder, unspecified: F41.9

## 2014-03-06 HISTORY — DX: Major depressive disorder, single episode, unspecified: F32.9

## 2014-03-06 HISTORY — DX: Depression, unspecified: F32.A

## 2014-03-06 MED ORDER — DIAZEPAM 5 MG PO TABS: 5.0000 mg | ORAL_TABLET | Freq: Two times a day (BID) | ORAL | Status: DC

## 2014-03-06 MED ORDER — MELOXICAM 7.5 MG PO TABS: 15.0000 mg | ORAL_TABLET | Freq: Every day | ORAL | Status: DC

## 2014-03-06 MED ORDER — HYDROCODONE-ACETAMINOPHEN 5-325 MG PO TABS: 1.0000 | ORAL_TABLET | Freq: Four times a day (QID) | ORAL | Status: DC | PRN

## 2014-03-06 MED ORDER — HYDROCODONE-ACETAMINOPHEN 5-325 MG PO TABS
2.0000 | ORAL_TABLET | Freq: Once | ORAL | Status: AC
Start: 2014-03-06 — End: 2014-03-06
  Administered 2014-03-06: 2 via ORAL
  Filled 2014-03-06: qty 2

## 2014-03-06 NOTE — ED Provider Notes (Signed)
CSN: [id number]     Arrival date & time 03/06/14  0035 History   First MD Initiated Contact with Patient 03/06/14 0221     Chief Complaint  Patient presents with  . Shoulder Pain    (Consider location/radiation/quality/duration/timing/severity/associated sxs/prior Treatment) HPI Comments: 23 year old male presents to the emergency department for further evaluation of right shoulder pain. Patient states that he fell 1.5 days ago on his right upper arm and shoulder after slipping on a wet floor. He denies head trauma or loss of consciousness. Patient has had fairly constant pain to his right shoulder since this time which is aching, nonradiating, and worse with movement and when lying on it. Patient states that he has tried massage to the area without relief. He has also taken ibuprofen with little effect. Patient denies associated fever, extremity weakness, and numbness or paresthesias. No prior injury to his right shoulder.  Patient is a 23 y.o. male presenting with shoulder pain. The history is provided by the patient. No language interpreter was used.  Shoulder Pain   Past Medical History  Diagnosis Date  . Anxiety   . Depression    Past Surgical History  Procedure Laterality Date  . Wisdom tooth extraction     No family history on file. History  Substance Use Topics  . Smoking status: Never Smoker   . Smokeless tobacco: Never Used  . Alcohol Use: No    Review of Systems  Musculoskeletal: Positive for myalgias and arthralgias.  All other systems reviewed and are negative.   Allergies  Review of patient's allergies indicates no known allergies.  Home Medications   Prior to Admission medications   Medication Sig Start Date End Date Taking? Authorizing Provider  Multiple Vitamin (MULTIVITAMIN WITH MINERALS) TABS tablet Take 1 tablet by mouth daily.   Yes Historical Provider, MD  PARoxetine (PAXIL) 20 MG tablet Take 30 mg by mouth every morning.    Yes Historical Provider,  MD  traZODone (DESYREL) 100 MG tablet Take 100 mg by mouth at bedtime.   Yes Historical Provider, MD  diazepam (VALIUM) 5 MG tablet Take 1 tablet (5 mg total) by mouth 2 (two) times daily. 03/06/14   Antony MaduraKelly Jeniah Kishi, PA-C  HYDROcodone-acetaminophen (NORCO/VICODIN) 5-325 MG per tablet Take 1 tablet by mouth every 6 (six) hours as needed for severe pain (If pain not controlled with Mobic and Valium). 03/06/14   Antony MaduraKelly Thresa Dozier, PA-C  meloxicam (MOBIC) 7.5 MG tablet Take 2 tablets (15 mg total) by mouth daily. 03/06/14   Antony MaduraKelly Taima Rada, PA-C   BP 126/66 mmHg  Pulse 94  Temp(Src) 98.1 F (36.7 C) (Oral)  Resp 18  SpO2 98%   Physical Exam  Constitutional: He is oriented to person, place, and time. He appears well-developed and well-nourished. No distress.  Nontoxic/nonseptic appearing  HENT:  Head: Normocephalic and atraumatic.  Eyes: Conjunctivae and EOM are normal. No scleral icterus.  Neck: Normal range of motion.  Cardiovascular: Normal rate, regular rhythm and intact distal pulses.   2+ distal radial pulses b/l  Pulmonary/Chest: Effort normal. No respiratory distress.  Musculoskeletal: He exhibits tenderness.       Right shoulder: He exhibits decreased range of motion (mild limitation with abduction), tenderness and pain. He exhibits no bony tenderness, no effusion, no crepitus, no deformity, normal pulse and normal strength.       Cervical back: Normal.       Right upper arm: Normal.  Neurological: He is alert and oriented to person, place, and time. He  exhibits normal muscle tone. Coordination normal.  Patient moving all extremities. No sensation deficits noted on exam. Patient has equal grip strength b/l as well as 5/5 strength against resistance in all major muscle groups bilaterally.  Skin: Skin is warm and dry. No rash noted. He is not diaphoretic. No erythema. No pallor.  Psychiatric: He has a normal mood and affect. His behavior is normal.  Nursing note and vitals reviewed.   ED Course   Procedures (including critical care time) Labs Review Labs Reviewed - No data to display  Imaging Review Dg Shoulder Right  03/06/2014   CLINICAL DATA:  Slip and fall injury 48 hr ago, landing on the right shoulder. Pain on the anterior surface.  EXAM: RIGHT SHOULDER - 2+ VIEW  COMPARISON:  None.  FINDINGS: There is no evidence of fracture or dislocation. There is no evidence of arthropathy or other focal bone abnormality. Soft tissues are unremarkable.  IMPRESSION: Negative.   Electronically Signed   By: Burman NievesWilliam  Stevens M.D.   On: 03/06/2014 02:02     EKG Interpretation None      MDM   Final diagnoses:  Right shoulder strain, initial encounter  Shoulder contusion, right, initial encounter  Fall, initial encounter    23 year old male presents to the emergency department for further evaluation of right shoulder pain after a slip and fall on a wet floor 1.5 days ago. No head trauma or loss of consciousness. Patient is neurovascularly intact on exam. No crepitus or deformity. No effusion. Imaging negative for fracture or dislocation. Symptoms and physical exam findings consistent with strain or rotator cuff right shoulder. Will prescribe patient Mobic and Valium for symptom management. Have discussed risk of frozen shoulder with immobilization; stretching emphasized. Return precautions discussed and provided. Patient agreeable to plan with no unaddressed concerns. Patient discharged in good condition; VSS.   Filed Vitals:   03/06/14 0117 03/06/14 0334  BP: 123/52 126/66  Pulse: 103 94  Temp: 98.7 F (37.1 C) 98.1 F (36.7 C)  TempSrc: Oral Oral  Resp: 15 18  SpO2: 99% 98%     Antony MaduraKelly Kaiyah Eber, PA-C 03/06/14 0542  Lyanne CoKevin M Campos, MD 03/06/14 [phone]

## 2014-03-06 NOTE — ED Notes (Signed)
Pt states that he fell on a wet floor 2 days ago; pt states that he landed on his rt shoulder; pt c/o rt shoulder pain

## 2014-03-06 NOTE — Discharge Instructions (Signed)
Rotator Cuff Injury Rotator cuff injury is any type of injury to the set of muscles and tendons that make up the stabilizing unit of your shoulder. This unit holds the ball of your upper arm bone (humerus) in the socket of your shoulder blade (scapula).  CAUSES Injuries to your rotator cuff most commonly come from sports or activities that cause your arm to be moved repeatedly over your head. Examples of this include throwing, weight lifting, swimming, or racquet sports. Long lasting (chronic) irritation of your rotator cuff can cause soreness and swelling (inflammation), bursitis, and eventual damage to your tendons, such as a tear (rupture). SIGNS AND SYMPTOMS Acute rotator cuff tear:  Sudden tearing sensation followed by severe pain shooting from your upper shoulder down your arm toward your elbow.  Decreased range of motion of your shoulder because of pain and muscle spasm.  Severe pain.  Inability to raise your arm out to the side because of pain and loss of muscle power (large tears). Chronic rotator cuff tear:  Pain that usually is worse at night and may interfere with sleep.  Gradual weakness and decreased shoulder motion as the pain worsens.  Decreased range of motion. Rotator cuff tendinitis:  Deep ache in your shoulder and the outside upper arm over your shoulder.  Pain that comes on gradually and becomes worse when lifting your arm to the side or turning it inward. DIAGNOSIS Rotator cuff injury is diagnosed through a medical history, physical exam, and imaging exam. The medical history helps determine the type of rotator cuff injury. Your health care provider will look at your injured shoulder, feel the injured area, and ask you to move your shoulder in different positions. X-ray exams typically are done to rule out other causes of shoulder pain, such as fractures. MRI is the exam of choice for the most severe shoulder injuries because the images show muscles and tendons.    TREATMENT  Chronic tear:  Medicine for pain, such as acetaminophen or ibuprofen.  Physical therapy and range-of-motion exercises may be helpful in maintaining shoulder function and strength.  Steroid injections into your shoulder joint.  Surgical repair of the rotator cuff if the injury does not heal with noninvasive treatment. Acute tear:  Anti-inflammatory medicines such as ibuprofen and naproxen to help reduce pain and swelling.  A sling to help support your arm and rest your rotator cuff muscles. Long-term use of a sling is not advised. It may cause significant stiffening of the shoulder joint.  Surgery may be considered within a few weeks, especially in younger, active people, to return the shoulder to full function.  Indications for surgical treatment include the following:  Age younger than 60 years.  Rotator cuff tears that are complete.  Physical therapy, rest, and anti-inflammatory medicines have been used for 6-8 weeks, with no improvement.  Employment or sporting activity that requires constant shoulder use. Tendinitis:  Anti-inflammatory medicines such as ibuprofen and naproxen to help reduce pain and swelling.  A sling to help support your arm and rest your rotator cuff muscles. Long-term use of a sling is not advised. It may cause significant stiffening of the shoulder joint.  Severe tendinitis may require:  Steroid injections into your shoulder joint.  Physical therapy.  Surgery. HOME CARE INSTRUCTIONS   Apply ice to your injury:  Put ice in a plastic bag.  Place a towel between your skin and the bag.  Leave the ice on for 20 minutes, 2-3 times a day.  If you   have a shoulder immobilizer (sling and straps), wear it until told otherwise by your health care provider.  You may want to sleep on several pillows or in a recliner at night to lessen swelling and pain.  Only take over-the-counter or prescription medicines for pain, discomfort, or fever as  directed by your health care provider.  Do simple hand squeezing exercises with a soft rubber ball to decrease hand swelling. SEEK MEDICAL CARE IF:   Your shoulder pain increases, or new pain or numbness develops in your arm, hand, or fingers.  Your hand or fingers are colder than your other hand. SEEK IMMEDIATE MEDICAL CARE IF:   Your arm, hand, or fingers are numb or tingling.  Your arm, hand, or fingers are increasingly swollen and painful, or they turn white or blue. MAKE SURE YOU:  Understand these instructions.  Will watch your condition.  Will get help right away if you are not doing well or get worse. Document Released: 04/01/2000 Document Revised: 04/09/2013 Document Reviewed: 11/14/2012 [employer] Patient Information 2015 [employer]. This information is not intended to replace advice given to you by your health care provider. Make sure you discuss any questions you have with your health care provider.  

## 2014-10-17 ENCOUNTER — Emergency Department (HOSPITAL_COMMUNITY)
Admission: EM | Admit: 2014-10-17 | Discharge: 2014-10-17 | Disposition: A | Discharge status: Home / Self Care | Payer: Self-pay | Attending: Emergency Medicine | Admitting: Emergency Medicine

## 2014-10-17 ENCOUNTER — Encounter (HOSPITAL_COMMUNITY): Payer: Self-pay | Admitting: Emergency Medicine

## 2014-10-17 DIAGNOSIS — Z791 Long term (current) use of non-steroidal anti-inflammatories (NSAID): Secondary | ICD-10-CM | POA: Insufficient documentation

## 2014-10-17 DIAGNOSIS — Z79899 Other long term (current) drug therapy: Secondary | ICD-10-CM | POA: Insufficient documentation

## 2014-10-17 DIAGNOSIS — F329 Major depressive disorder, single episode, unspecified: Secondary | ICD-10-CM | POA: Insufficient documentation

## 2014-10-17 DIAGNOSIS — F419 Anxiety disorder, unspecified: Secondary | ICD-10-CM | POA: Insufficient documentation

## 2014-10-17 DIAGNOSIS — L0231 Cutaneous abscess of buttock: Secondary | ICD-10-CM | POA: Insufficient documentation

## 2014-10-17 MED ORDER — OXYCODONE-ACETAMINOPHEN 5-325 MG PO TABS
1.0000 | ORAL_TABLET | Freq: Once | ORAL | Status: AC
  Administered 2014-10-17: 1 via ORAL
  Filled 2014-10-17: qty 1

## 2014-10-17 MED ORDER — LIDOCAINE-EPINEPHRINE 2 %-1:100000 IJ SOLN
INTRAMUSCULAR | Status: AC
  Administered 2014-10-17: 18:00:00 10 mL via INTRADERMAL
  Filled 2014-10-17: qty 1

## 2014-10-17 MED ORDER — HYDROCODONE-ACETAMINOPHEN 5-325 MG PO TABS: 1.0000 | ORAL_TABLET | Freq: Four times a day (QID) | ORAL | Status: DC | PRN

## 2014-10-17 MED ORDER — DOXYCYCLINE HYCLATE 100 MG PO CAPS: 100.0000 mg | ORAL_CAPSULE | Freq: Two times a day (BID) | ORAL | Status: DC

## 2014-10-17 MED ORDER — LIDOCAINE-EPINEPHRINE 2 %-1:100000 IJ SOLN
10.0000 mL | Freq: Once | INTRAMUSCULAR | Status: AC
  Administered 2014-10-17: 10 mL via INTRADERMAL

## 2014-10-17 NOTE — ED Provider Notes (Signed)
CSN: [id number]     Arrival date & time 10/17/14  1533 History   This chart was scribed for non-physician practitioner, Fayrene HelperBowie Zubair Lofton, PA-C, working with Rolland PorterMark James, MD by Marica OtterNusrat Rahman, ED Scribe. This patient was seen in room WTR7/WTR7 and the patient's care was started at 4:10 PM.   Chief Complaint  Patient presents with  . Abscess   HPI PCP: No primary care provider on file. HPI Comments: Jeremy Gallagher is a 24 y.o. male, with PMHx noted below including Hx of abscess on buttocks one year ago, who presents to the Emergency Department complaining of an abscess to his buttocks with associated 10/10 pain onset 1.5 days ago. Pt reports applying a heating pad to affected area without relief. Pt denies vomiting, diarrhea, fever, chills, Hx of DM, Hx of HIV. Pt reports he is UTD on his tetanus vaccine. Pt denies any known allergies to any meds.   Past Medical History  Diagnosis Date  . Anxiety   . Depression    Past Surgical History  Procedure Laterality Date  . Wisdom tooth extraction     History reviewed. No pertinent family history. History  Substance Use Topics  . Smoking status: Never Smoker   . Smokeless tobacco: Never Used  . Alcohol Use: No    Review of Systems  Constitutional: Negative for fever and chills.  Gastrointestinal: Negative for nausea, vomiting and diarrhea.  Skin: Positive for wound (abscess on buttocks with associated pain).   Allergies  Review of patient's allergies indicates no known allergies.  Home Medications   Prior to Admission medications   Medication Sig Start Date End Date Taking? Authorizing Provider  diazepam (VALIUM) 5 MG tablet Take 1 tablet (5 mg total) by mouth 2 (two) times daily. 03/06/14   Antony MaduraKelly Humes, PA-C  HYDROcodone-acetaminophen (NORCO/VICODIN) 5-325 MG per tablet Take 1 tablet by mouth every 6 (six) hours as needed for severe pain (If pain not controlled with Mobic and Valium). 03/06/14   Antony MaduraKelly Humes, PA-C  meloxicam (MOBIC) 7.5 MG  tablet Take 2 tablets (15 mg total) by mouth daily. 03/06/14   Antony MaduraKelly Humes, PA-C  Multiple Vitamin (MULTIVITAMIN WITH MINERALS) TABS tablet Take 1 tablet by mouth daily.    Historical Provider, MD  PARoxetine (PAXIL) 20 MG tablet Take 30 mg by mouth every morning.     Historical Provider, MD  traZODone (DESYREL) 100 MG tablet Take 100 mg by mouth at bedtime.    Historical Provider, MD   Triage Vitals: BP 138/90 mmHg  Pulse 113  Temp(Src) 98.3 F (36.8 C) (Oral)  Resp 18  SpO2 97% Physical Exam  Constitutional: He is oriented to person, place, and time. He appears well-developed and well-nourished. No distress.  HENT:  Head: Normocephalic and atraumatic.  Eyes: Conjunctivae and EOM are normal.  Cardiovascular: Normal rate.   Pulmonary/Chest: Effort normal. No respiratory distress.  Musculoskeletal: Normal range of motion.  Neurological: He is alert and oriented to person, place, and time.  Skin: Skin is warm and dry. There is erythema.  Area of induration and excoriation noted to the gluteal cleft, favoring left side with erythema; tender with no rectal involvement.    Psychiatric: He has a normal mood and affect. His behavior is normal.  Nursing note and vitals reviewed.   ED Course  Procedures (including critical care time) DIAGNOSTIC STUDIES: Oxygen Saturation is 97% on RA, nl by my interpretation.    COORDINATION OF CARE: 4:13 PM-Discussed treatment plan which includes incision and drainage, warm  compresses at home, antibiotics, with pt at bedside and pt agreed to plan. Pt advised to go to urgent care in 2 days for wound check.    INCISION AND DRAINAGE Performed by: Fayrene Helper, PA-C Consent: Verbal consent obtained. Risks and benefits: risks, benefits and alternatives were discussed Type: abscess Body area: L buttock adjacent to gluteal cleft Anesthesia: local infiltration Incision was made with a scalpel. Local anesthetic: lidocaine 2% with epinephrine Anesthetic  total: 4 ml Complexity: complex Blunt dissection to break up loculations Drainage: purulent Drainage amount: moderate Packing material: 1/4 in iodoform gauze Patient tolerance: Patient tolerated the procedure well with no immediate complications.  Labs Review Labs Reviewed - No data to display  Imaging Review No results found.   EKG Interpretation None      MDM   Final diagnoses:  Left buttock abscess    BP 138/90 mmHg  Pulse 113  Temp(Src) 98.3 F (36.8 C) (Oral)  Resp 18  SpO2 97%  I personally performed the services described in this documentation, which was scribed in my presence. The recorded information has been reviewed and is accurate.     Fayrene Helper, PA-C 10/17/14 1736  Rolland Porter, MD 10/20/14 2142

## 2014-10-17 NOTE — Discharge Instructions (Signed)
Follow instructions below for care of the abscess. Follow-up at urgent care in 2 days for wound check and packing removal. Take antibiotic for the full duration as described. Take pain medication as needed.  Abscess Care After An abscess (also called a boil or furuncle) is an infected area that contains a collection of pus. Signs and symptoms of an abscess include pain, tenderness, redness, or hardness, or you may feel a moveable soft area under your skin. An abscess can occur anywhere in the body. The infection may spread to surrounding tissues causing cellulitis. A cut (incision) by the surgeon was made over your abscess and the pus was drained out. Gauze may have been packed into the space to provide a drain that will allow the cavity to heal from the inside outwards. The boil may be painful for 5 to 7 days. Most people with a boil do not have high fevers. Your abscess, if seen early, may not have localized, and may not have been lanced. If not, another appointment may be required for this if it does not get better on its own or with medications. HOME CARE INSTRUCTIONS   Only take over-the-counter or prescription medicines for pain, discomfort, or fever as directed by your caregiver.  When you bathe, soak and then remove gauze or iodoform packs at least daily or as directed by your caregiver. You may then wash the wound gently with mild soapy water. Repack with gauze or do as your caregiver directs. SEEK IMMEDIATE MEDICAL CARE IF:   You develop increased pain, swelling, redness, drainage, or bleeding in the wound site.  You develop signs of generalized infection including muscle aches, chills, fever, or a general ill feeling.  An oral temperature above 102 F (38.9 C) develops, not controlled by medication. See your caregiver for a recheck if you develop any of the symptoms described above. If medications (antibiotics) were prescribed, take them as directed. Document Released: 10/21/2004  Document Revised: 06/27/2011 Document Reviewed: 06/18/2007 [facility] Patient Information 2015 [place], [place]. This information is not intended to replace advice given to you by your health care provider. Make sure you discuss any questions you have with your health care provider.

## 2014-10-17 NOTE — ED Notes (Signed)
Pt states he has a painful abscess on his buttocks. Complaining of nausea from all the pain. Denies vomiting, diarrhea, fever, chills.

## 2014-10-20 ENCOUNTER — Encounter (HOSPITAL_COMMUNITY): Payer: Self-pay | Admitting: *Deleted

## 2014-10-20 ENCOUNTER — Emergency Department (HOSPITAL_COMMUNITY)
Admission: EM | Admit: 2014-10-20 | Discharge: 2014-10-20 | Disposition: A | Discharge status: Home / Self Care | Payer: Self-pay | Attending: Emergency Medicine | Admitting: Emergency Medicine

## 2014-10-20 DIAGNOSIS — Z21 Asymptomatic human immunodeficiency virus [HIV] infection status: Secondary | ICD-10-CM | POA: Insufficient documentation

## 2014-10-20 DIAGNOSIS — E119 Type 2 diabetes mellitus without complications: Secondary | ICD-10-CM | POA: Insufficient documentation

## 2014-10-20 DIAGNOSIS — Z48817 Encounter for surgical aftercare following surgery on the skin and subcutaneous tissue: Secondary | ICD-10-CM | POA: Insufficient documentation

## 2014-10-20 DIAGNOSIS — Z5189 Encounter for other specified aftercare: Secondary | ICD-10-CM

## 2014-10-20 DIAGNOSIS — F329 Major depressive disorder, single episode, unspecified: Secondary | ICD-10-CM | POA: Insufficient documentation

## 2014-10-20 DIAGNOSIS — L0502 Pilonidal sinus with abscess: Secondary | ICD-10-CM | POA: Insufficient documentation

## 2014-10-20 DIAGNOSIS — Z79899 Other long term (current) drug therapy: Secondary | ICD-10-CM | POA: Insufficient documentation

## 2014-10-20 DIAGNOSIS — F419 Anxiety disorder, unspecified: Secondary | ICD-10-CM | POA: Insufficient documentation

## 2014-10-20 MED ORDER — HYDROCODONE-ACETAMINOPHEN 5-325 MG PO TABS: 1.0000 | ORAL_TABLET | ORAL | Status: DC | PRN

## 2014-10-20 NOTE — Discharge Instructions (Signed)
Ibuprofen for pain. Norco for severe pain. Sitz baths several times a day. Follow up with primary care doctor. Return if worsening.    Abscess Care After An abscess (also called a boil or furuncle) is an infected area that contains a collection of pus. Signs and symptoms of an abscess include pain, tenderness, redness, or hardness, or you may feel a moveable soft area under your skin. An abscess can occur anywhere in the body. The infection may spread to surrounding tissues causing cellulitis. A cut (incision) by the surgeon was made over your abscess and the pus was drained out. Gauze may have been packed into the space to provide a drain that will allow the cavity to heal from the inside outwards. The boil may be painful for 5 to 7 days. Most people with a boil do not have high fevers. Your abscess, if seen early, may not have localized, and may not have been lanced. If not, another appointment may be required for this if it does not get better on its own or with medications. HOME CARE INSTRUCTIONS   Only take over-the-counter or prescription medicines for pain, discomfort, or fever as directed by your caregiver.  When you bathe, soak and then remove gauze or iodoform packs at least daily or as directed by your caregiver. You may then wash the wound gently with mild soapy water. Repack with gauze or do as your caregiver directs. SEEK IMMEDIATE MEDICAL CARE IF:   You develop increased pain, swelling, redness, drainage, or bleeding in the wound site.  You develop signs of generalized infection including muscle aches, chills, fever, or a general ill feeling.  An oral temperature above 102 F (38.9 C) develops, not controlled by medication. See your caregiver for a recheck if you develop any of the symptoms described above. If medications (antibiotics) were prescribed, take them as directed. Document Released: 10/21/2004 Document Revised: 06/27/2011 Document Reviewed: 06/18/2007 [facility] Patient  Information 2015 [place], [place]. This information is not intended to replace advice given to you by your health care provider. Make sure you discuss any questions you have with your health care provider.

## 2014-10-20 NOTE — ED Notes (Signed)
Pt states he needs his packing removed from an abscess that was drained on 6/29. Pt states he was unable to remove the packing himself due to the pain.

## 2014-10-20 NOTE — ED Provider Notes (Signed)
CSN: [id number]     Arrival date & time 10/20/14  1515 History  This chart was scribed for non-physician practitioner, Jaynie Crumbleatyana Carlee Tesfaye, PA-C working with Derwood KaplanAnkit Nanavati, MD by Placido SouLogan Joldersma, ED scribe. This patient was seen in room WTR6/WTR6 and the patient's care was started at 4:12 PM.   No chief complaint on file.  The history is provided by the patient. No language interpreter was used.    HPI Comments: Jeremy Gallagher is a 24 y.o. male who presents to the Emergency Department for the removal of packing from an incision that [facility] Pt notes that he's still experiencing some yellow drainage from the wound and has been irrigating the wound with a sterile solution at home. He further notes that he has been taking his prescribed antibiotics. Pt notes experiencing constant, moderate, associated pain to the affected area but denies any other symptoms.   Past Medical History  Diagnosis Date  . Anxiety   . Depression    Past Surgical History  Procedure Laterality Date  . Wisdom tooth extraction     No family history on file. History  Substance Use Topics  . Smoking status: Never Smoker   . Smokeless tobacco: Never Used  . Alcohol Use: No    Review of Systems  Constitutional: Negative for fever and chills.  Musculoskeletal: Positive for myalgias.  Skin: Positive for wound. Negative for color change and pallor.      Allergies  Review of patient's allergies indicates no known allergies.  Home Medications   Prior to Admission medications   Medication Sig Start Date End Date Taking? Authorizing Provider  clonazePAM (KLONOPIN) 1 MG tablet Take 1 mg by mouth daily as needed for anxiety.  08/21/14   Historical Provider, MD  doxycycline (VIBRAMYCIN) 100 MG capsule Take 1 capsule (100 mg total) by mouth 2 (two) times daily. 10/17/14   Fayrene HelperBowie Tran, PA-C  HYDROcodone-acetaminophen (NORCO/VICODIN) 5-325 MG per tablet Take 1  tablet by mouth every 6 (six) hours as needed for severe pain (If pain not controlled with Mobic and Valium). 10/17/14   Fayrene HelperBowie Tran, PA-C  ibuprofen (ADVIL,MOTRIN) 200 MG tablet Take 800 mg by mouth every 6 (six) hours as needed for headache, mild pain or moderate pain.    Historical Provider, MD  Multiple Vitamin (MULTIVITAMIN WITH MINERALS) TABS tablet Take 1 tablet by mouth daily.    Historical Provider, MD  PARoxetine (PAXIL) 20 MG tablet Take 30 mg by mouth every morning.     Historical Provider, MD   BP 137/87 mmHg  Pulse 80  Temp(Src) 98.2 F (36.8 C) (Oral)  Resp 18  SpO2 100% Physical Exam  Constitutional: He is oriented to person, place, and time. He appears well-developed and well-nourished. No distress.  HENT:  Head: Normocephalic and atraumatic.  Mouth/Throat: Oropharynx is clear and moist.  Eyes: Conjunctivae and EOM are normal. Pupils are equal, round, and reactive to light.  Neck: Normal range of motion. Neck supple. No tracheal deviation present.  Cardiovascular: Normal rate.   Pulmonary/Chest: Breath sounds normal. No respiratory distress.  Abdominal: Soft.  Musculoskeletal: Normal range of motion.  Neurological: He is alert and oriented to person, place, and time.  Skin: Skin is warm and dry.  pilonidal cyst that is incised and drained with packing intact. No surrounding erythema. No purulent drainage noted. ttp around the abcess  Psychiatric: He has a normal mood and affect. His behavior is normal.  Nursing note and vitals reviewed.   ED Course  Procedures  DIAGNOSTIC STUDIES: Oxygen Saturation is 100% on RA, normal by my interpretation.    COORDINATION OF CARE: 4:19 PM Discussed treatment plan with pt at bedside and pt agreed to plan.  Labs Review Labs Reviewed - No data to display  Imaging Review No results found.   EKG Interpretation None      MDM   Final diagnoses:  Encounter for wound re-check  Pilonidal sinus with abscess    Patient here  for wound recheck and packing removal. Patient will pilonidal abscess that was incised and drained 2 days ago. History of HIV and diabetes. He was started on antibiotics which she is still taking. On exam, after removal of the packing, wound appears to be healing well. I did not see or was able to express any purulent drainage. There is no surrounding erythema. There is still some tenderness to palpation. Instructed to start sitz bath and soapy water, will prescribe some pain medications, Norco 10 tablet prescribed, follow up in 2 days or as needed.  Filed Vitals:   10/20/14 1520  BP: 137/87  Pulse: 80  Temp: 98.2 F (36.8 C)  TempSrc: Oral  Resp: 18  SpO2: 100%    I personally performed the services described in this documentation, which was scribed in my presence. The recorded information has been reviewed and is accurate.   Jaynie Crumble, PA-C 10/20/14 9604  Derwood Kaplan, MD 10/22/14 5409

## 2014-12-12 ENCOUNTER — Encounter (HOSPITAL_COMMUNITY): Payer: Self-pay | Admitting: *Deleted

## 2014-12-12 ENCOUNTER — Emergency Department (HOSPITAL_COMMUNITY)
Admission: EM | Admit: 2014-12-12 | Discharge: 2014-12-12 | Disposition: A | Discharge status: Home / Self Care | Payer: Self-pay | Attending: Emergency Medicine | Admitting: Emergency Medicine

## 2014-12-12 DIAGNOSIS — F419 Anxiety disorder, unspecified: Secondary | ICD-10-CM | POA: Insufficient documentation

## 2014-12-12 DIAGNOSIS — F329 Major depressive disorder, single episode, unspecified: Secondary | ICD-10-CM | POA: Insufficient documentation

## 2014-12-12 DIAGNOSIS — L0501 Pilonidal cyst with abscess: Secondary | ICD-10-CM | POA: Insufficient documentation

## 2014-12-12 MED ORDER — HYDROCODONE-ACETAMINOPHEN 5-325 MG PO TABS
2.0000 | ORAL_TABLET | Freq: Once | ORAL | Status: AC
  Administered 2014-12-12: 2 via ORAL
  Filled 2014-12-12: qty 2

## 2014-12-12 MED ORDER — LIDOCAINE-EPINEPHRINE (PF) 2 %-1:200000 IJ SOLN
20.0000 mL | Freq: Once | INTRAMUSCULAR | Status: AC
  Administered 2014-12-12: 20 mL
  Filled 2014-12-12: qty 20

## 2014-12-12 MED ORDER — HYDROCODONE-ACETAMINOPHEN 5-325 MG PO TABS: 1.0000 | ORAL_TABLET | Freq: Four times a day (QID) | ORAL | Status: DC | PRN

## 2014-12-12 NOTE — ED Notes (Signed)
Pt states that he has had an abscess to his buttocks that he has been seen for several times in the last 2 months; pt states that the pain has gotten bad in the last 2 days

## 2014-12-12 NOTE — Discharge Instructions (Signed)
Recommend warm compresses or soaks and a warm bath 3-4 times per day to promote drainage. Have your wound rechecked in 2 days and have your packing removed at this time. Take  Doxycycline every 12 hours; this should equal 1 tablet every 12 hours. Take Norco as needed for severe pain. Follow up with a surgeon to discuss long term treatment options.  Abscess Care After An abscess (also called a boil or furuncle) is an infected area that contains a collection of pus. Signs and symptoms of an abscess include pain, tenderness, redness, or hardness, or you may feel a moveable soft area under your skin. An abscess can occur anywhere in the body. The infection may spread to surrounding tissues causing cellulitis. A cut (incision) by the surgeon was made over your abscess and the pus was drained out. Gauze may have been packed into the space to provide a drain that will allow the cavity to heal from the inside outwards. The boil may be painful for 5 to 7 days. Most people with a boil do not have high fevers. Your abscess, if seen early, may not have localized, and may not have been lanced. If not, another appointment may be required for this if it does not get better on its own or with medications. HOME CARE INSTRUCTIONS   Only take over-the-counter or prescription medicines for pain, discomfort, or fever as directed by your caregiver.  When you bathe, soak and then remove gauze or iodoform packs at least daily or as directed by your caregiver. You may then wash the wound gently with mild soapy water. Repack with gauze or do as your caregiver directs. SEEK IMMEDIATE MEDICAL CARE IF:   You develop increased pain, swelling, redness, drainage, or bleeding in the wound site.  You develop signs of generalized infection including muscle aches, chills, fever, or a general ill feeling.  An oral temperature above 102 F (38.9 C) develops, not controlled by medication. See your caregiver for a recheck if you  develop any of the symptoms described above. If medications (antibiotics) were prescribed, take them as directed. Document Released: 10/21/2004 Document Revised: 06/27/2011 Document Reviewed: 06/18/2007 [facility] Patient Information 2015 [place], [place]. This information is not intended to replace advice given to you by your health care provider. Make sure you discuss any questions you have with your health care provider.

## 2014-12-12 NOTE — ED Provider Notes (Signed)
CSN: [id number]     Arrival date & time 12/12/14  0153 History   First MD Initiated Contact with Patient 12/12/14 0249     Chief Complaint  Patient presents with  . Abscess     (Consider location/radiation/quality/duration/timing/severity/associated sxs/prior Treatment) Patient is a 24 y.o. male presenting with abscess. The history is provided by the patient. No language interpreter was used.  Abscess Location:  Ano-genital Ano-genital abscess location:  L buttock Abscess quality: fluctuance, induration, painful and redness   Abscess quality: not weeping   Red streaking: no   Duration:  2 days Progression:  Worsening Pain details:    Quality:  Sharp and pressure   Severity:  Moderate   Duration:  2 days   Timing:  Constant   Progression:  Worsening Chronicity:  Recurrent Context: not diabetes   Relieved by:  Nothing Exacerbated by: Pressure to area/sitting. Ineffective treatments:  NSAIDs Associated symptoms: no fever and no vomiting     Past Medical History  Diagnosis Date  . Anxiety   . Depression    Past Surgical History  Procedure Laterality Date  . Wisdom tooth extraction     No family history on file. Social History  Substance Use Topics  . Smoking status: Never Smoker   . Smokeless tobacco: Never Used  . Alcohol Use: Yes     Comment: occ    Review of Systems  Constitutional: Negative for fever.  Gastrointestinal: Negative for vomiting.  Musculoskeletal: Positive for myalgias.  Skin: Positive for color change.       +abscess  All other systems reviewed and are negative.   Allergies  Review of patient's allergies indicates no known allergies.  Home Medications   Prior to Admission medications   Medication Sig Start Date End Date Taking? Authorizing Provider  clonazePAM (KLONOPIN) 1 MG tablet Take 1 mg by mouth daily as needed for anxiety.  08/21/14  Yes Historical Provider, MD  ibuprofen (ADVIL,MOTRIN) 200 MG tablet Take 800 mg by mouth every 6  (six) hours as needed for headache, mild pain or moderate pain.   Yes Historical Provider, MD  PARoxetine (PAXIL) 20 MG tablet Take 30 mg by mouth every morning.    Yes Historical Provider, MD  doxycycline (VIBRAMYCIN) 100 MG capsule Take 1 capsule (100 mg total) by mouth 2 (two) times daily. Patient not taking: Reported on 12/12/2014 10/17/14   Fayrene Helper, PA-C  HYDROcodone-acetaminophen (NORCO/VICODIN) 5-325 MG per tablet Take 1 tablet by mouth every 6 (six) hours as needed for severe pain. 12/12/14   Antony Madura, PA-C   BP 135/72 mmHg  Pulse 94  Temp(Src) 98.4 F (36.9 C) (Oral)  Resp 16  SpO2 94%   Physical Exam  Constitutional: He is oriented to person, place, and time. He appears well-developed and well-nourished. No distress.  Nontoxic/nonseptic appearing  HENT:  Head: Normocephalic and atraumatic.  Eyes: Conjunctivae and EOM are normal. No scleral icterus.  Neck: Normal range of motion.  Pulmonary/Chest: Effort normal. No respiratory distress.  Genitourinary:     Pilonidal cyst with central fluctuance noted just L of the intergluteal cleft. Mild erythema. There is surrounding induration. No heat to touch. No red streaking. No drainage.  Musculoskeletal: Normal range of motion.  Neurological: He is alert and oriented to person, place, and time.  Skin: Skin is warm and dry. No rash noted. He is not diaphoretic. No erythema. No pallor.  Psychiatric: He has a normal mood and affect. His behavior is normal.  Nursing note and  vitals reviewed.   ED Course  Procedures (including critical care time) Labs Review Labs Reviewed - No data to display  Imaging Review No results found.   I have personally reviewed and evaluated these images and lab results as part of my medical decision-making.   EKG Interpretation None      INCISION AND DRAINAGE Performed by: Antony Madura Consent: Verbal consent obtained. Risks and benefits: risks, benefits and alternatives were  discussed Type: abscess  Body area: L buttock  Anesthesia: local infiltration  Incision was made with a scalpel.  Local anesthetic: lidocaine 2% with epinephrine  Anesthetic total: 4 ml  Complexity: complex Blunt dissection to break up loculations  Drainage: purulent  Drainage amount: moderate  Packing material: 1/4 in iodoform gauze  Patient tolerance: Patient tolerated the procedure well with no immediate complications.    MDM   Final diagnoses:  Pilonidal cyst with abscess    Patient with skin abscess amenable to incision and drainage. Wound packed with iodoform gauze; wound recheck in 2 days. Encouraged home warm soaks and flushing. Mild signs of cellulitis is surrounding skin. Patient discharged home; he has 8 tablets of doxycycline at home from a prior cyst. Have advised that patient take 1 tablet twice a day until follow up as he does not want any additional antibiotics prescribed for cost reasons. Patient discharged in good condition with no unaddressed concerns.   Filed Vitals:   12/12/14 0202 12/12/14 0438  BP: 125/71 135/72  Pulse: 102 94  Temp: 98.7 F (37.1 C) 98.4 F (36.9 C)  TempSrc: Oral Oral  Resp: 20 16  SpO2: 100% 94%     Antony Madura, PA-C 12/12/14 0447  Layla Maw Ward, DO 12/12/14 [phone]

## 2014-12-15 ENCOUNTER — Encounter (HOSPITAL_COMMUNITY): Payer: Self-pay | Admitting: Emergency Medicine

## 2014-12-15 ENCOUNTER — Emergency Department (HOSPITAL_COMMUNITY)
Admission: EM | Admit: 2014-12-15 | Discharge: 2014-12-16 | Disposition: A | Discharge status: Home / Self Care | Payer: Self-pay | Attending: Emergency Medicine | Admitting: Emergency Medicine

## 2014-12-15 DIAGNOSIS — F329 Major depressive disorder, single episode, unspecified: Secondary | ICD-10-CM | POA: Insufficient documentation

## 2014-12-15 DIAGNOSIS — Z09 Encounter for follow-up examination after completed treatment for conditions other than malignant neoplasm: Secondary | ICD-10-CM

## 2014-12-15 DIAGNOSIS — F419 Anxiety disorder, unspecified: Secondary | ICD-10-CM | POA: Insufficient documentation

## 2014-12-15 DIAGNOSIS — Z79899 Other long term (current) drug therapy: Secondary | ICD-10-CM | POA: Insufficient documentation

## 2014-12-15 DIAGNOSIS — Z4801 Encounter for change or removal of surgical wound dressing: Secondary | ICD-10-CM | POA: Insufficient documentation

## 2014-12-15 DIAGNOSIS — Z792 Long term (current) use of antibiotics: Secondary | ICD-10-CM | POA: Insufficient documentation

## 2014-12-15 NOTE — ED Notes (Signed)
Pt states he had an abcess lanced 2 days ago and has come to have the packing removed. Area is above his sacrum. Alert and oriented.

## 2014-12-16 NOTE — Discharge Instructions (Signed)
RETURN HERE AS NEEDED FOR NEW OR RECURRENT SYMPTOMS OF ABSCESS. FOLLOW UP WITH SURGERY AS DISCUSSED FOR FURTHER TREATMENT OF RECURRENT PILONIDAL ABSCESS.

## 2014-12-16 NOTE — ED Provider Notes (Signed)
CSN: [id number]     Arrival date & time 12/15/14  2301 History   First MD Initiated Contact with Patient 12/15/14 2339     Chief Complaint  Patient presents with  . Wound Check     (Consider location/radiation/quality/duration/timing/severity/associated sxs/prior Treatment) Patient is a 24 y.o. male presenting with wound check. The history is provided by the patient. No language interpreter was used.  Wound Check Pertinent negatives include no fever, myalgias or nausea. Associated symptoms comments: Here for recheck of lanced abscess that was packed during procedure. No fever. Procedure performed 2 days ago. He states pain is ongoing. He reports he is compliant with medications. .    Past Medical History  Diagnosis Date  . Anxiety   . Depression    Past Surgical History  Procedure Laterality Date  . Wisdom tooth extraction     History reviewed. No pertinent family history. Social History  Substance Use Topics  . Smoking status: Never Smoker   . Smokeless tobacco: Never Used  . Alcohol Use: Yes     Comment: occ    Review of Systems  Constitutional: Negative for fever.  Gastrointestinal: Negative for nausea.  Musculoskeletal: Negative for myalgias.  Skin: Positive for wound.      Allergies  Review of patient's allergies indicates no known allergies.  Home Medications   Prior to Admission medications   Medication Sig Start Date End Date Taking? Authorizing Provider  clonazePAM (KLONOPIN) 1 MG tablet Take 1 mg by mouth daily as needed for anxiety.  08/21/14   Historical Provider, MD  doxycycline (VIBRAMYCIN) 100 MG capsule Take 1 capsule (100 mg total) by mouth 2 (two) times daily. Patient not taking: Reported on 12/12/2014 10/17/14   Fayrene Helper, PA-C  HYDROcodone-acetaminophen (NORCO/VICODIN) 5-325 MG per tablet Take 1 tablet by mouth every 6 (six) hours as needed for severe pain. 12/12/14   Antony Madura, PA-C  ibuprofen (ADVIL,MOTRIN) 200 MG tablet Take 800 mg by mouth  every 6 (six) hours as needed for headache, mild pain or moderate pain.    Historical Provider, MD  PARoxetine (PAXIL) 20 MG tablet Take 30 mg by mouth every morning.     Historical Provider, MD   BP 146/82 mmHg  Pulse 91  Temp(Src) 98.2 F (36.8 C) (Oral)  Resp 16  SpO2 100% Physical Exam  Constitutional: He is oriented to person, place, and time. He appears well-developed and well-nourished. No distress.  Musculoskeletal: Normal range of motion.  Neurological: He is alert and oriented to person, place, and time.  Skin:  Healing abscess pilonidal area with packing intact. No surrounding cellulitic changes. No excessive purulent drainage.     ED Course  Procedures (including critical care time) Labs Review Labs Reviewed - No data to display  Imaging Review No results found. I have personally reviewed and evaluated these images and lab results as part of my medical decision-making.   EKG Interpretation None      MDM   Final diagnoses:  Encounter for recheck of abscess following incision and drainage   Packing removed, care instructions provided. Encouraged surgical follow up as he has had recurrent pilonidal abscesses, which he reports is in process.     Elpidio Anis, PA-C 12/16/14 4098  Tomasita Crumble, MD 12/23/14 [phone]

## 2014-12-16 NOTE — ED Notes (Signed)
Pt had packing removed. Has referral to PCP for further wound management. No other c/c.

## 2015-07-07 ENCOUNTER — Ambulatory Visit (INDEPENDENT_AMBULATORY_CARE_PROVIDER_SITE_OTHER): Payer: Self-pay

## 2015-07-07 ENCOUNTER — Ambulatory Visit: Payer: Self-pay

## 2015-07-07 DIAGNOSIS — F909 Attention-deficit hyperactivity disorder, unspecified type: Secondary | ICD-10-CM

## 2015-07-07 DIAGNOSIS — B20 Human immunodeficiency virus [HIV] disease: Secondary | ICD-10-CM

## 2015-07-07 DIAGNOSIS — K6289 Other specified diseases of anus and rectum: Secondary | ICD-10-CM

## 2015-07-07 DIAGNOSIS — Z79899 Other long term (current) drug therapy: Secondary | ICD-10-CM

## 2015-07-07 DIAGNOSIS — Z113 Encounter for screening for infections with a predominantly sexual mode of transmission: Secondary | ICD-10-CM

## 2015-07-07 LAB — COMPLETE METABOLIC PANEL WITH GFR
ALBUMIN: 4.7 g/dL (ref 3.6–5.1)
ALT: 70 U/L — ABNORMAL HIGH (ref 9–46)
AST: 66 U/L — AB (ref 10–40)
Alkaline Phosphatase: 47 U/L (ref 40–115)
BUN: 8 mg/dL (ref 7–25)
CALCIUM: 10.2 mg/dL (ref 8.6–10.3)
CO2: 25 mmol/L (ref 20–31)
CREATININE: 0.94 mg/dL (ref 0.60–1.35)
Chloride: 106 mmol/L (ref 98–110)
GFR, Est African American: >89 mL/min (ref >=60)
GFR, Est Non African American: >89 mL/min (ref >=60)
Glucose, Bld: 94 mg/dL (ref 65–99)
POTASSIUM: 4.6 mmol/L (ref 3.5–5.3)
Sodium: 139 mmol/L (ref 135–146)
Total Bilirubin: 0.2 mg/dL (ref 0.2–1.2)
Total Protein: 7.9 g/dL (ref 6.1–8.1)

## 2015-07-07 LAB — CBC WITH DIFFERENTIAL/PLATELET
Basophils Absolute: 0 10*3/uL (ref 0.0–0.1)
Basophils Relative: 0 % (ref 0–1)
Eosinophils Absolute: 0.1 10*3/uL (ref 0.0–0.7)
Eosinophils Relative: 1 % (ref 0–5)
HCT: 47.1 % (ref 39.0–52.0)
HEMOGLOBIN: 15.4 g/dL (ref 13.0–17.0)
LYMPHS PCT: 37 % (ref 12–46)
Lymphs Abs: 3 10*3/uL (ref 0.7–4.0)
MCH: 29 pg (ref 26.0–34.0)
MCHC: 32.7 g/dL (ref 30.0–36.0)
MCV: 88.7 fL (ref 78.0–100.0)
MONO ABS: 0.5 10*3/uL (ref 0.1–1.0)
MONOS PCT: 6 % (ref 3–12)
MPV: 9.6 fL (ref 8.6–12.4)
Neutro Abs: 4.6 10*3/uL (ref 1.7–7.7)
Neutrophils Relative %: 56 % (ref 43–77)
Platelets: 254 10*3/uL (ref 150–400)
RBC: 5.31 MIL/uL (ref 4.22–5.81)
RDW: 16.4 % — ABNORMAL HIGH (ref 11.5–15.5)
WBC: 8.2 10*3/uL (ref 4.0–10.5)

## 2015-07-07 LAB — LIPID PANEL
Cholesterol: 227 mg/dL — ABNORMAL HIGH (ref 125–200)
HDL: 32 mg/dL — AB (ref >=40)
LDL CALC: 124 mg/dL (ref <130)
TRIGLYCERIDES: 353 mg/dL — AB (ref <150)
Total CHOL/HDL Ratio: 7.1 Ratio — ABNORMAL HIGH (ref <=5.0)
VLDL: 71 mg/dL — ABNORMAL HIGH (ref <30)

## 2015-07-08 LAB — HEPATITIS B SURFACE ANTIBODY,QUALITATIVE

## 2015-07-08 LAB — URINALYSIS
Bilirubin Urine: NEGATIVE
Glucose, UA: NEGATIVE
HGB URINE DIPSTICK: NEGATIVE
Ketones, ur: NEGATIVE
LEUKOCYTES UA: NEGATIVE
Nitrite: NEGATIVE
Protein, ur: NEGATIVE
Specific Gravity, Urine: 1.018 (ref 1.001–1.035)
pH: 5.5 (ref 5.0–8.0)

## 2015-07-08 LAB — HEPATITIS A ANTIBODY, TOTAL: Hep A Total Ab: NONREACTIVE

## 2015-07-08 LAB — HEPATITIS B SURFACE ANTIGEN: Hepatitis B Surface Ag: NEGATIVE

## 2015-07-08 LAB — RPR

## 2015-07-08 LAB — HEPATITIS B CORE ANTIBODY, TOTAL: HEP B C TOTAL AB: NONREACTIVE

## 2015-07-08 LAB — HEPATITIS C ANTIBODY: HCV Ab: NEGATIVE

## 2015-07-08 NOTE — Addendum Note (Signed)
Addended by: Mariea ClontsGREEN, Marrio Scribner D on: 07/08/2015 03:47 PM   Modules accepted: Orders

## 2015-07-09 LAB — QUANTIFERON TB GOLD ASSAY (BLOOD)
INTERFERON GAMMA RELEASE ASSAY: NEGATIVE
Quantiferon Nil Value: 0.12 IU/mL
Quantiferon Tb Ag Minus Nil Value: 0 IU/mL

## 2015-07-09 LAB — HIV-1 RNA ULTRAQUANT REFLEX TO GENTYP+
HIV 1 RNA QUANT: 37861 {copies}/mL — AB (ref <20)
HIV-1 RNA QUANT, LOG: 4.58 {Log_copies}/mL — AB (ref <1.30)

## 2015-07-09 LAB — T-HELPER CELL (CD4) - (RCID CLINIC ONLY)
CD4 % Helper T Cell: 8 % — ABNORMAL LOW (ref 33–55)
CD4 T Cell Abs: 260 /uL — ABNORMAL LOW (ref 400–2700)

## 2015-07-09 LAB — URINE CYTOLOGY ANCILLARY ONLY
Chlamydia: NEGATIVE
NEISSERIA GONORRHEA: NEGATIVE

## 2015-07-14 DIAGNOSIS — K6289 Other specified diseases of anus and rectum: Secondary | ICD-10-CM | POA: Insufficient documentation

## 2015-07-14 DIAGNOSIS — F909 Attention-deficit hyperactivity disorder, unspecified type: Secondary | ICD-10-CM | POA: Insufficient documentation

## 2015-07-14 NOTE — Progress Notes (Addendum)
Patient was referred by [facility] and his primary physician Dr [name].  Patient states he has been in a same sex relationship as the receptive partner for almost two years without condom use. His last negative HIV test was the summer of 2016.  Patient requested HIV test after pricking finger with a syringe he found with the intention of throwing in trash.   After the finger puncture he became very paranoid and anxious about possibly being infected by the syringe and scheduled visit with primary care physician for testing.   After he received the HIV results he came to the conclusion the diagnosis was unrelated to the finger stick and most likely his current male partner.  His Mother is present and they are both anxious and have many concerns related to possible exposure to others in the home and if this means he has AIDS.  His Mother stated she loves her son gay or not and  will support him through this process .  Currently  she is angry since  she feels Jameer's partner was aware of his HIV status and never disclosed.   Jill AlexandersJustin states his use of alcohol has increase since being diagnosed and he hopes his anxiety will decrease after his testing returns and he will decrease alcoholol intake.  His Mother  has noticed his increased use and is very concerned but is glad he is not using marijuana or other drugs to cope.  Jill AlexandersJustin was introduced to [employer] Lilly and CompanyJodi, Veterinary surgeonCounselor and [employer], Research scientist (physical sciences)eer Support Leader.  I encouraged Jill AlexandersJustin to consider scheduling an appointment with Lennox LaityJodi for additional support and counseling.  I think assistance with a few coping mechanism would help him greatly and possible decrease his need to drink alcohol.  I explained risk associated with alcohol use and antidepressants or anti anxiety medications.   Sexual history : last year 2 male partners   6 months: 1 and 5110 lifetime male partners. No history of male sex.     9 tattoos and 5 piercings : nose, lip, tongue and bilateral nipples all done in shops by  professional.   No vaccines given today due to patient's current mental status.  Medical records received.   Laurell Josephsammy K King, RN

## 2015-07-14 NOTE — Addendum Note (Signed)
Addended by: Laurell JosephsKING, TAMMY K on: 07/14/2015 05:45 PM   Modules accepted: Medications

## 2015-07-16 LAB — HIV-1 GENOTYPR PLUS

## 2015-07-16 LAB — HLA B*5701: HLA-B*5701 w/rflx HLA-B High: NEGATIVE

## 2015-07-22 ENCOUNTER — Encounter: Payer: Self-pay | Admitting: Internal Medicine

## 2015-07-22 ENCOUNTER — Ambulatory Visit (INDEPENDENT_AMBULATORY_CARE_PROVIDER_SITE_OTHER): Payer: Self-pay | Admitting: Internal Medicine

## 2015-07-22 ENCOUNTER — Ambulatory Visit: Payer: Self-pay | Admitting: *Deleted

## 2015-07-22 VITALS — BP 142/111 | Temp 98.8°F | Wt 172.0 lb

## 2015-07-22 DIAGNOSIS — F411 Generalized anxiety disorder: Secondary | ICD-10-CM

## 2015-07-22 DIAGNOSIS — B20 Human immunodeficiency virus [HIV] disease: Secondary | ICD-10-CM | POA: Insufficient documentation

## 2015-07-22 DIAGNOSIS — F1910 Other psychoactive substance abuse, uncomplicated: Secondary | ICD-10-CM | POA: Insufficient documentation

## 2015-07-22 DIAGNOSIS — E785 Hyperlipidemia, unspecified: Secondary | ICD-10-CM | POA: Insufficient documentation

## 2015-07-22 DIAGNOSIS — R748 Abnormal levels of other serum enzymes: Secondary | ICD-10-CM

## 2015-07-22 MED ORDER — ABACAVIR-DOLUTEGRAVIR-LAMIVUD 600-50-300 MG PO TABS: 1.0000 | ORAL_TABLET | Freq: Every day | ORAL | Status: DC

## 2015-07-22 NOTE — Progress Notes (Signed)
Patient Active Problem List   Diagnosis Date Noted  . HIV disease (HCC) 07/22/2015    Priority: High  . Dyslipidemia 07/22/2015  . Elevated liver enzymes 07/22/2015  . Generalized anxiety disorder 07/22/2015  . Polysubstance abuse 07/22/2015  . Attention deficit hyperactivity disorder (ADHD) 07/14/2015  . Perirectal cyst 07/14/2015  . ALLERGIC RHINITIS 11/22/2007    Patient's Medications  New Prescriptions   ABACAVIR-DOLUTEGRAVIR-LAMIVUDINE (TRIUMEQ) 600-50-300 MG TABLET    Take 1 tablet by mouth daily.  Previous Medications   AMPHETAMINE-DEXTROAMPHETAMINE (ADDERALL) 30 MG TABLET    Take 30 mg by mouth daily.   CLONAZEPAM (KLONOPIN) 1 MG TABLET    Take 1 mg by mouth daily as needed for anxiety.    DOXYCYCLINE (VIBRAMYCIN) 100 MG CAPSULE    Take 1 capsule (100 mg total) by mouth 2 (two) times daily.   HYDROCODONE-ACETAMINOPHEN (NORCO/VICODIN) 5-325 MG PER TABLET    Take 1 tablet by mouth every 6 (six) hours as needed for severe pain.   IBUPROFEN (ADVIL,MOTRIN) 200 MG TABLET    Take 800 mg by mouth every 6 (six) hours as needed for headache, mild pain or moderate pain. Reported on 07/14/2015   PAROXETINE (PAXIL) 20 MG TABLET    Take 30 mg by mouth every morning. Reported on 07/22/2015  Modified Medications   No medications on file  Discontinued Medications   No medications on file    Subjective: Jeremy Gallagher is in for his initial visit to establish care for recently diagnosed HIV infection. He is referred by his primary care physician, Dr. Willey Blade. He is accompanied today by his mother,  Jeremy Gallagher. Sye is an exclusively day male who was recently in a 2 year relationship with her male partner where he was routinely the receptive partner with anal intercourse without condoms. They never discussed HIV status. Dearius recently found a discarded syringe and protect himself on the finger. He became very concerned about possible infection leading him to be tested for HIV. After  learning that he was HIV positive he shared that information with his mother, grandmother and best friend.  He also informed his partner and broke off that relationship. He has not been sexually active since learning of his infection.  He has ADHD and chronic anxiety disorder. This is being managed by Dr. August Saucer. He has been taking Adderall, Paxil and Klonopin for many years.  Since learning of his diagnosis is anxiety has become much more severe and frequent. When he becomes very anxious he also has problems with nausea and vomiting. He did start drinking much more heavily after his diagnosis. He was drinking over a pint of vodka daily but has cut down dramatically since his initial intake visit here and states that he has not had anything to drink in the past 4 days. He does smoke marijuana on a daily basis and has for many years. He states that he smokes 2  " blunts" daily. He does not believe he can quit. He states that this helps with his anxiety and also stimulates his appetite. He denies ever having any other illicit drug use. He is currently in school at [employer]  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  Review of Systems: Review of Systems  Constitutional: Positive for weight loss. Negative for fever, chills, malaise/fatigue and diaphoresis.  HENT: Negative for sore throat.   Respiratory: Negative for cough, sputum production and shortness of breath.   Cardiovascular:  Negative for chest pain.  Gastrointestinal: Positive for nausea and vomiting. Negative for heartburn, abdominal pain and diarrhea.        Anorexia.  Genitourinary: Negative for dysuria.  Musculoskeletal: Negative for myalgias and joint pain.  Skin: Negative for rash.  Neurological: Negative for dizziness and headaches.  Psychiatric/Behavioral: Positive for depression and substance abuse. Negative for suicidal ideas. The patient is nervous/anxious.     Past Medical History  Diagnosis Date  . Anxiety     . Depression   . Allergy   . HIV infection [facility])     Social History  Substance Use Topics  . Smoking status: Never Smoker   . Smokeless tobacco: Never Used  . Alcohol Use: 2.4 oz/week    4 Shots of liquor per week     Comment: drinking increased since HIV diagnosis     Family History  Problem Relation Age of Onset  . Cancer Maternal Aunt     No Known Allergies  Objective:  Filed Vitals:   07/22/15 1455  BP: 142/111  Temp: 98.8 F (37.1 C)  TempSrc: Oral  Weight: 172 lb (78.019 kg)   There is no height on file to calculate BMI.  Physical Exam  Constitutional: He is oriented to person, place, and time.  He is very anxious with pressured speech.  He and his mother are usually talking simultaneously during the exam.  HENT:  Mouth/Throat: No oropharyngeal exudate.  Eyes: Conjunctivae are normal.  Cardiovascular: Normal rate and regular rhythm.   No murmur heard. Pulmonary/Chest: Breath sounds normal.  Abdominal: Soft. He exhibits no mass. There is no tenderness.  Musculoskeletal: Normal range of motion.  Neurological: He is alert and oriented to person, place, and time.  Skin: No rash noted.  He has 9 tattoos and 5 skin piercings.  Psychiatric: Affect normal.  He is quite anxious.    Lab Results Lab Results  Component Value Date   WBC 8.2 07/07/2015   HGB 15.4 07/07/2015   HCT 47.1 07/07/2015   MCV 88.7 07/07/2015   PLT 254 07/07/2015    Lab Results  Component Value Date   CREATININE 0.94 07/07/2015   BUN 8 07/07/2015   NA 139 07/07/2015   K 4.6 07/07/2015   CL 106 07/07/2015   CO2 25 07/07/2015    Lab Results  Component Value Date   ALT 70* 07/07/2015   AST 66* 07/07/2015   ALKPHOS 47 07/07/2015   BILITOT 0.2 07/07/2015    Lab Results  Component Value Date   CHOL 227* 07/07/2015   HDL 32* 07/07/2015   LDLCALC 124 07/07/2015   TRIG 353* 07/07/2015   CHOLHDL 7.1* 07/07/2015    Lab Results HIV 1 RNA QUANT (copies/mL)  Date Value   07/07/2015 37861*   CD4 T CELL ABS (/uL)  Date Value  07/07/2015 260*      Problem List Items Addressed This Visit      High   HIV disease (HCC)     He has newly diagnosed, asymptomatic HIV infection that is likely to have been acquired through sexual contact with his recent partner. I reviewed the interpretation of CD4 count and HIV viral loads. He has moderate reduction in CD4 count. He is very motivated to start therapy. He does not eat meals on a regular basis. His genotype does not reveal any resistance mutations so he is a good candidate to start once daily Triumeq. I have reviewed this with him in detail and also had him speak  with our ID pharmacist, Jeremy Gallagher. He has ADAP which will pay for his medication. He will follow-up after lab work in 6 weeks. I talked him about the utmost importance of careful partners collection in the future and condom use.      Relevant Medications   abacavir-dolutegravir-lamiVUDine (TRIUMEQ) 600-50-300 MG tablet   Other Relevant Orders   T-helper cell (CD4)- ([facility] clinic only)   HIV 1 RNA quant-no reflex-bld   CBC   Comprehensive metabolic panel     Unprioritized   Dyslipidemia   Elevated liver enzymes   Generalized anxiety disorder - Primary    He has fairly severe acute on chronic anxiety.  Fortunately I think that, to some degree, this anxiety will motivate him to be more adherent with therapy and clinic visits. I did introduce him to our behavioral health counselor, Jeremy Gallagher, today. I've encouraged him to meet with her on a regular basis. He has also been introduced to our . Counseling meetings.peer counselor coordinator and informed about our peer counseling sessions.      Polysubstance abuse    I talked to him about the adverse consequences of heavy alcohol use and  daily marijuana use.  I suspect that his elevated liver enzymes are due to his recent heavy alcohol intake. He does seem motivated to cut down and quit drinking alcohol but is  not currently committed to trying to stop smoking marijuana.           Jeremy AstersJohn Shiasia Porro, MD [facility] [phone] pager   [phone] cell 07/22/2015, 5:36 PM

## 2015-07-22 NOTE — Assessment & Plan Note (Signed)
He has fairly severe acute on chronic anxiety.  Fortunately I think that, to some degree, this anxiety will motivate him to be more adherent with therapy and clinic visits. I did introduce him to our behavioral health counselor, Jenel LucksJodi Herring, today. I've encouraged him to meet with her on a regular basis. He has also been introduced to our . Counseling meetings.peer counselor coordinator and informed about our peer counseling sessions.

## 2015-07-22 NOTE — Assessment & Plan Note (Signed)
I talked to him about the adverse consequences of heavy alcohol use and  daily marijuana use.  I suspect that his elevated liver enzymes are due to his recent heavy alcohol intake. He does seem motivated to cut down and quit drinking alcohol but is not currently committed to trying to stop smoking marijuana.

## 2015-07-22 NOTE — Progress Notes (Signed)
Patient ID: Jeremy RossettiJustin D Gallagher, male   DOB: 01/03/1991, 25 y.o.   MRN: [id number] HPI: Jeremy Gallagher is a 25 y.o. male who is here for his initial HIV visit.   Allergies: No Known Allergies  Vitals: Temp: 98.8 F (37.1 C) (04/05 1455) Temp Source: Oral (04/05 1455) BP: 142/111 mmHg (04/05 1455)  Past Medical History: Past Medical History  Diagnosis Date  . Anxiety   . Depression     Social History: Social History   Social History  . Marital Status: Single    Spouse Name: N/A  . Number of Children: N/A  . Years of Education: N/A   Social History Main Topics  . Smoking status: Never Smoker   . Smokeless tobacco: Never Used  . Alcohol Use: 2.4 oz/week    4 Shots of liquor per week     Comment: drinking increased since HIV diagnosis   . Drug Use: No  . Sexual Activity:    Partners: Female    CopyBirth Control/ Protection: None   Other Topics Concern  . None   Social History Narrative    Previous Regimen: Naive   Current Regimen: None  Labs: HIV 1 RNA QUANT (copies/mL)  Date Value  07/07/2015 37861*   CD4 T CELL ABS (/uL)  Date Value  07/07/2015 260*   HEP B S AB (no units)  Date Value  07/07/2015 INDETER*   HEPATITIS B SURFACE AG (no units)  Date Value  07/07/2015 NEGATIVE   HCV AB (no units)  Date Value  07/07/2015 NEGATIVE    CrCl: CrCl cannot be calculated (Unknown ideal weight.).  Lipids:    Component Value Date/Time   CHOL 227* 07/07/2015 1236   TRIG 353* 07/07/2015 1236   HDL 32* 07/07/2015 1236   CHOLHDL 7.1* 07/07/2015 1236   VLDL 71* 07/07/2015 1236   LDLCALC 124 07/07/2015 1236    Assessment: Jeremy Gallagher is here with his mom to discuss starting ART. He has wild type virus. Both he and his mom are very talkative. He has ADHD syndrome and a lot of anxiety. I think he will be compliance and his mom will keep track of him. Since he doesn't eat a regular meal, we are going to use Triumeq instead. He has applied for ADAP.     Recommendations:  Triumeq 1 PO qday F/u with VL and Dr. Paulino Rilyampbell  Pham, Kerri PerchesMinh Quang, PharmD Clinical Infectious Disease Pharmacist [facility] 07/22/2015, 4:24 PM

## 2015-07-22 NOTE — Assessment & Plan Note (Signed)
He has newly diagnosed, asymptomatic HIV infection that is likely to have been acquired through sexual contact with his recent partner. I reviewed the interpretation of CD4 count and HIV viral loads. He has moderate reduction in CD4 count. He is very motivated to start therapy. He does not eat meals on a regular basis. His genotype does not reveal any resistance mutations so he is a good candidate to start once daily Triumeq. I have reviewed this with him in detail and also had him speak with our ID pharmacist, Ulyses SouthwardMinh Pham. He has ADAP which will pay for his medication. He will follow-up after lab work in 6 weeks. I talked him about the utmost importance of careful partners collection in the future and condom use.

## 2015-07-22 NOTE — BH Specialist Note (Signed)
Counselor met with Jeremy Gallagher today in the exam room.  Patient was oriented times four with good affect and dress.  Patient was alert and talkative.  Patient was accompanied by his mother who talked a lot too.  Patient's mother was a little overbearing and would speak constantly for patient.  Counselor had to repeatedly ask mom to wait in answering so patient could answer.  Counselor provided information about support/counseling services.  Patient indicated that he has a lot of anxiety and needed to learn better skills for coping.  Counselor provided support and encouragement about being newly diagnosed and offered reassurance accordingly.  Counselor communicated to patient about the [facility] peer support program that he is invited too.  Patient was very excited about coming to support group as well as meeting with counselor.  Rolena Infante, MA [facility]

## 2015-07-23 ENCOUNTER — Telehealth: Payer: Self-pay | Admitting: *Deleted

## 2015-07-23 NOTE — Telephone Encounter (Signed)
[place] Mail from [place] with [facility] stating patient called for his Hiv Rx and said that his ADAP was approved and their records did not show that.  Called [facility] back and spoke to FlorenceBen and informed him that per Marcelino DusterMichelle, Presenter, broadcastingfinancial coordinator, his ADAP is still pending; he did his paperwork on 07/16/15. Wendall MolaJacqueline Cockerham

## 2015-08-25 ENCOUNTER — Encounter: Payer: Self-pay | Admitting: Internal Medicine

## 2015-09-02 ENCOUNTER — Other Ambulatory Visit (INDEPENDENT_AMBULATORY_CARE_PROVIDER_SITE_OTHER): Payer: Self-pay

## 2015-09-02 DIAGNOSIS — B20 Human immunodeficiency virus [HIV] disease: Secondary | ICD-10-CM

## 2015-09-02 LAB — COMPREHENSIVE METABOLIC PANEL
ALBUMIN: 5 g/dL (ref 3.6–5.1)
ALT: 46 U/L (ref 9–46)
AST: 37 U/L (ref 10–40)
Alkaline Phosphatase: 39 U/L — ABNORMAL LOW (ref 40–115)
BILIRUBIN TOTAL: 0.3 mg/dL (ref 0.2–1.2)
BUN: 12 mg/dL (ref 7–25)
CALCIUM: 10.4 mg/dL — AB (ref 8.6–10.3)
CO2: 26 mmol/L (ref 20–31)
CREATININE: 1.42 mg/dL — AB (ref 0.60–1.35)
Chloride: 101 mmol/L (ref 98–110)
Glucose, Bld: 97 mg/dL (ref 65–99)
Potassium: 4.2 mmol/L (ref 3.5–5.3)
SODIUM: 139 mmol/L (ref 135–146)
TOTAL PROTEIN: 8 g/dL (ref 6.1–8.1)

## 2015-09-02 LAB — CBC
HEMATOCRIT: 41.9 % (ref 38.5–50.0)
HEMOGLOBIN: 13.8 g/dL (ref 13.2–17.1)
MCH: 29.2 pg (ref 27.0–33.0)
MCHC: 32.9 g/dL (ref 32.0–36.0)
MCV: 88.8 fL (ref 80.0–100.0)
MPV: 9.1 fL (ref 7.5–12.5)
Platelets: 255 10*3/uL (ref 140–400)
RBC: 4.72 MIL/uL (ref 4.20–5.80)
RDW: 15.1 % — ABNORMAL HIGH (ref 11.0–15.0)
WBC: 7.2 10*3/uL (ref 3.8–10.8)

## 2015-09-03 LAB — T-HELPER CELL (CD4) - (RCID CLINIC ONLY)
CD4 T CELL HELPER: 10 % — AB (ref 33–55)
CD4 T Cell Abs: 360 /uL — ABNORMAL LOW (ref 400–2700)

## 2015-09-03 LAB — HIV-1 RNA QUANT-NO REFLEX-BLD

## 2015-10-06 ENCOUNTER — Ambulatory Visit (INDEPENDENT_AMBULATORY_CARE_PROVIDER_SITE_OTHER): Payer: Self-pay | Admitting: Internal Medicine

## 2015-10-06 ENCOUNTER — Encounter: Payer: Self-pay | Admitting: Internal Medicine

## 2015-10-06 VITALS — BP 128/81 | HR 99 | Temp 97.9°F | Ht 63.0 in | Wt 177.0 lb

## 2015-10-06 DIAGNOSIS — B20 Human immunodeficiency virus [HIV] disease: Secondary | ICD-10-CM

## 2015-10-06 DIAGNOSIS — Z23 Encounter for immunization: Secondary | ICD-10-CM

## 2015-10-06 DIAGNOSIS — F411 Generalized anxiety disorder: Secondary | ICD-10-CM

## 2015-10-06 DIAGNOSIS — F1910 Other psychoactive substance abuse, uncomplicated: Secondary | ICD-10-CM

## 2015-10-06 NOTE — Addendum Note (Signed)
Addended by: Rejeana BrockMURRAY, CANDACE A on: 10/06/2015 12:22 PM   Modules accepted: Orders

## 2015-10-06 NOTE — Assessment & Plan Note (Signed)
His chronic anxiety and situational depression are improving. I had him meet with Jenel LucksJodi Herring, our behavioral health counselor today.

## 2015-10-06 NOTE — Progress Notes (Signed)
Patient Active Problem List   Diagnosis Date Noted  . HIV disease (HCC) 07/22/2015    Priority: High  . Dyslipidemia 07/22/2015  . Elevated liver enzymes 07/22/2015  . Generalized anxiety disorder 07/22/2015  . Polysubstance abuse 07/22/2015  . Attention deficit hyperactivity disorder (ADHD) 07/14/2015  . Perirectal cyst 07/14/2015  . ALLERGIC RHINITIS 11/22/2007    Patient's Medications  New Prescriptions   No medications on file  Previous Medications   ABACAVIR-DOLUTEGRAVIR-LAMIVUDINE (TRIUMEQ) 600-50-300 MG TABLET    Take 1 tablet by mouth daily.   AMPHETAMINE-DEXTROAMPHETAMINE (ADDERALL) 30 MG TABLET    Take 30 mg by mouth daily.   CLONAZEPAM (KLONOPIN) 1 MG TABLET    Take 1 mg by mouth daily as needed for anxiety.    HYDROCODONE-ACETAMINOPHEN (NORCO/VICODIN) 5-325 MG PER TABLET    Take 1 tablet by mouth every 6 (six) hours as needed for severe pain.   IBUPROFEN (ADVIL,MOTRIN) 200 MG TABLET    Take 800 mg by mouth every 6 (six) hours as needed for headache, mild pain or moderate pain. Reported on 07/14/2015   PAROXETINE (PAXIL) 20 MG TABLET    Take 30 mg by mouth every morning. Reported on 10/06/2015  Modified Medications   No medications on file  Discontinued Medications   DOXYCYCLINE (VIBRAMYCIN) 100 MG CAPSULE    Take 1 capsule (100 mg total) by mouth 2 (two) times daily.    Subjective: Jeremy Gallagher is in for his routine HIV follow-up visit. He has had no problems obtaining, taking or tolerating his Triumeq since he started several months ago. He is very proud of the fact that he has not missed any doses. He has an alarm clock set for noon each day to remind him to take it. He states that his depression and anxiety have been coming under better control. He has a new boyfriend, Jeremy Gallagher, who is with him today. Jeremy Gallagher does not know his HIV status. They have not been sexually active yet.  Review of Systems: Review of Systems  Constitutional: Negative for fever, chills,  weight loss, malaise/fatigue and diaphoresis.  HENT: Negative for sore throat.   Respiratory: Negative for cough, sputum production and shortness of breath.   Cardiovascular: Negative for chest pain.  Gastrointestinal: Negative for nausea, vomiting, abdominal pain and diarrhea.  Genitourinary: Negative for dysuria.  Musculoskeletal: Negative for myalgias and joint pain.  Skin: Negative for rash.  Neurological: Negative for dizziness and headaches.  Psychiatric/Behavioral: Positive for depression and substance abuse. The patient is nervous/anxious.     Past Medical History  Diagnosis Date  . Anxiety   . Depression   . Allergy   . HIV infection [facility])     Social History  Substance Use Topics  . Smoking status: Never Smoker   . Smokeless tobacco: Never Used  . Alcohol Use: 2.4 oz/week    4 Shots of liquor per week     Comment: drinking increased since HIV diagnosis     Family History  Problem Relation Age of Onset  . Cancer Maternal Aunt     No Known Allergies  Objective:  Filed Vitals:   10/06/15 1119  BP: 128/81  Pulse: 99  Temp: 97.9 F (36.6 C)  TempSrc: Oral  Height:  (1.6 m)  Weight: 177 lb (80.287 kg)   Body mass index is 31.36 kg/(m^2).  Physical Exam  Constitutional: He is oriented to person, place, and time.  He is in good spirits. He became  tearful (tears of joy) when he saw his undetectable viral load.  HENT:  Mouth/Throat: No oropharyngeal exudate.  Eyes: Conjunctivae are normal.  Cardiovascular: Normal rate and regular rhythm.   No murmur heard. Pulmonary/Chest: Breath sounds normal.  Abdominal: Soft. There is no tenderness.  Musculoskeletal: Normal range of motion.  Neurological: He is alert and oriented to person, place, and time.  Skin: No rash noted.  Psychiatric: Mood and affect normal.    Lab Results Lab Results  Component Value Date   WBC 7.2 09/02/2015   HGB 13.8 09/02/2015   HCT 41.9 09/02/2015   MCV 88.8 09/02/2015    PLT 255 09/02/2015    Lab Results  Component Value Date   CREATININE 1.42* 09/02/2015   BUN 12 09/02/2015   NA 139 09/02/2015   K 4.2 09/02/2015   CL 101 09/02/2015   CO2 26 09/02/2015    Lab Results  Component Value Date   ALT 46 09/02/2015   AST 37 09/02/2015   ALKPHOS 39* 09/02/2015   BILITOT 0.3 09/02/2015    Lab Results  Component Value Date   CHOL 227* 07/07/2015   HDL 32* 07/07/2015   LDLCALC 124 07/07/2015   TRIG 353* 07/07/2015   CHOLHDL 7.1* 07/07/2015    Lab Results HIV 1 RNA QUANT (copies/mL)  Date Value  09/02/2015 <20  07/07/2015 37861*   CD4 T CELL ABS (/uL)  Date Value  09/02/2015 360*  07/07/2015 260*      Problem List Items Addressed This Visit      High   HIV disease (HCC)    His infection has come under excellent control due to his 100% adherence with Triumeq. He will follow-up after blood work in 6 months. I will arrange testing for [name] here today. They have been given condoms.      Relevant Orders   T-helper cell (CD4)- ([facility] clinic only)   HIV 1 RNA quant-no reflex-bld   Comprehensive metabolic panel     Unprioritized   Generalized anxiety disorder - Primary    His chronic anxiety and situational depression are improving. I had him meet with Jenel LucksJodi Herring, our behavioral health counselor today.      Polysubstance abuse    I encouraged him to continue to try to cut down on his alcohol intake.           Cliffton AstersJohn Rekia Kujala, MD [facility] [phone] pager   [phone] cell 10/06/2015, 11:52 AM

## 2015-10-06 NOTE — Assessment & Plan Note (Signed)
His infection has come under excellent control due to his 100% adherence with Triumeq. He will follow-up after blood work in 6 months. I will arrange testing for Jeremy Gallagher here today. They have been given condoms.

## 2015-10-06 NOTE — Assessment & Plan Note (Signed)
I encouraged him to continue to try to cut down on his alcohol intake.

## 2015-10-26 ENCOUNTER — Encounter: Payer: Self-pay | Admitting: Internal Medicine

## 2015-10-26 ENCOUNTER — Ambulatory Visit: Payer: Self-pay

## 2015-11-30 ENCOUNTER — Other Ambulatory Visit (INDEPENDENT_AMBULATORY_CARE_PROVIDER_SITE_OTHER): Payer: Self-pay

## 2015-11-30 ENCOUNTER — Ambulatory Visit (INDEPENDENT_AMBULATORY_CARE_PROVIDER_SITE_OTHER): Payer: Self-pay | Admitting: *Deleted

## 2015-11-30 DIAGNOSIS — Z23 Encounter for immunization: Secondary | ICD-10-CM

## 2015-11-30 DIAGNOSIS — B20 Human immunodeficiency virus [HIV] disease: Secondary | ICD-10-CM

## 2015-11-30 LAB — COMPREHENSIVE METABOLIC PANEL
ALT: 24 U/L (ref 9–46)
AST: 22 U/L (ref 10–40)
Albumin: 4.4 g/dL (ref 3.6–5.1)
Alkaline Phosphatase: 48 U/L (ref 40–115)
BUN: 11 mg/dL (ref 7–25)
CALCIUM: 9.7 mg/dL (ref 8.6–10.3)
CO2: 25 mmol/L (ref 20–31)
Chloride: 104 mmol/L (ref 98–110)
Creat: 0.99 mg/dL (ref 0.60–1.35)
GLUCOSE: 95 mg/dL (ref 65–99)
POTASSIUM: 4.3 mmol/L (ref 3.5–5.3)
Sodium: 140 mmol/L (ref 135–146)
Total Bilirubin: 0.2 mg/dL (ref 0.2–1.2)
Total Protein: 7 g/dL (ref 6.1–8.1)

## 2015-11-30 MED ORDER — HPV QUADRIVALENT VACCINE IM SUSP: 0.5000 mL | Freq: Once | INTRAMUSCULAR | 0 refills | Status: DC

## 2015-11-30 MED ORDER — HPV 9-VALENT RECOMB VACCINE IM SUSP: 0.5000 mL | Freq: Once | INTRAMUSCULAR | Status: DC

## 2015-12-01 LAB — T-HELPER CELL (CD4) - (RCID CLINIC ONLY)
CD4 % Helper T Cell: 10 % — ABNORMAL LOW (ref 33–55)
CD4 T Cell Abs: 410 /uL (ref 400–2700)

## 2015-12-01 LAB — HIV-1 RNA QUANT-NO REFLEX-BLD
HIV 1 RNA QUANT: 47 {copies}/mL — AB (ref <20)
HIV-1 RNA QUANT, LOG: 1.67 {Log_copies}/mL — AB (ref <1.30)

## 2016-01-07 ENCOUNTER — Telehealth: Payer: Self-pay | Admitting: *Deleted

## 2016-01-07 ENCOUNTER — Ambulatory Visit: Payer: Self-pay | Admitting: Internal Medicine

## 2016-01-07 DIAGNOSIS — B20 Human immunodeficiency virus [HIV] disease: Secondary | ICD-10-CM

## 2016-01-07 MED ORDER — ABACAVIR-DOLUTEGRAVIR-LAMIVUD 600-50-300 MG PO TABS: 1.0000 | ORAL_TABLET | Freq: Every day | ORAL | 5 refills | Status: DC

## 2016-01-07 NOTE — Telephone Encounter (Signed)
Unable to contact patient by phone.  Asked the pharmacy to remind the patient to call for a new MD appt.

## 2016-01-20 ENCOUNTER — Encounter (HOSPITAL_COMMUNITY): Payer: Self-pay | Admitting: Emergency Medicine

## 2016-01-20 ENCOUNTER — Emergency Department (HOSPITAL_COMMUNITY)
Admission: EM | Admit: 2016-01-20 | Discharge: 2016-01-21 | Disposition: A | Discharge status: Home / Self Care | Payer: Self-pay | Attending: Emergency Medicine | Admitting: Emergency Medicine

## 2016-01-20 DIAGNOSIS — R1111 Vomiting without nausea: Secondary | ICD-10-CM

## 2016-01-20 DIAGNOSIS — F909 Attention-deficit hyperactivity disorder, unspecified type: Secondary | ICD-10-CM | POA: Insufficient documentation

## 2016-01-20 DIAGNOSIS — K644 Residual hemorrhoidal skin tags: Secondary | ICD-10-CM | POA: Insufficient documentation

## 2016-01-20 MED ORDER — SODIUM CHLORIDE 0.9 % IV BOLUS (SEPSIS)
1000.0000 mL | Freq: Once | INTRAVENOUS | Status: AC
  Administered 2016-01-21: 1000 mL via INTRAVENOUS

## 2016-01-20 MED ORDER — ONDANSETRON HCL 4 MG/2ML IJ SOLN
4.0000 mg | Freq: Once | INTRAMUSCULAR | Status: AC
  Administered 2016-01-21: 4 mg via INTRAVENOUS
  Filled 2016-01-20: qty 2

## 2016-01-20 NOTE — ED Provider Notes (Signed)
[facility]-EMERGENCY DEPT Provider Note   CSN: [id number] Arrival date & time: 01/20/16  2128  By signing my name below, I, Rosario Adie, attest that this documentation has been prepared under the direction and in the presence of Gilda Crease, MD. Electronically Signed: Rosario Adie, ED Scribe. 01/20/16. 11:32 PM.  History   Chief Complaint Chief Complaint  Patient presents with  . Emesis  . Rectal Pain   The history is provided by the patient. No language interpreter was used.   HPI Comments: Jeremy Gallagher is a 25 y.o. male with a PMHx of HIV infection (CD4 count: 10) and polysubstance abuse, who presents to the Emergency Department complaining of intermittent episodes of nausea/emesis onset ~5-6 days ago. Pt reports associated burning epigastric abdominal pain, excessive passing of gas, and rectal pain since the onset of his symptoms. Pt has otherwise had normal BMs since the onset of his symptoms. His abdominal pain is moderately exacerbated after PO intake. No alleviating factors noted. Denies diarrhea, or any other associated symptoms.   Past Medical History:  Diagnosis Date  . Allergy   . Anxiety   . Depression   . HIV infection [employer])    Patient Active Problem List   Diagnosis Date Noted  . HIV disease (HCC) 07/22/2015  . Dyslipidemia 07/22/2015  . Elevated liver enzymes 07/22/2015  . Generalized anxiety disorder 07/22/2015  . Polysubstance abuse 07/22/2015  . Attention deficit hyperactivity disorder (ADHD) 07/14/2015  . Perirectal cyst 07/14/2015  . ALLERGIC RHINITIS 11/22/2007   Past Surgical History:  Procedure Laterality Date  . WISDOM TOOTH EXTRACTION      Home Medications    Prior to Admission medications   Medication Sig Start Date End Date Taking? Authorizing Provider  amphetamine-dextroamphetamine (ADDERALL) 30 MG tablet Take 30 mg by mouth daily.   Yes Historical Provider, MD  clonazePAM (KLONOPIN) 1 MG tablet Take 1 mg by mouth  daily as needed for anxiety.  08/21/14  Yes Historical Provider, MD  ibuprofen (ADVIL,MOTRIN) 200 MG tablet Take 800 mg by mouth every 6 (six) hours as needed for headache, mild pain or moderate pain. Reported on 07/14/2015   Yes Historical Provider, MD  abacavir-dolutegravir-lamiVUDine (TRIUMEQ) 600-50-300 MG tablet Take 1 tablet by mouth daily. Patient not taking: Reported on 01/21/2016 01/07/16   Cliffton Asters, MD  HYDROcodone-acetaminophen (NORCO/VICODIN) 5-325 MG per tablet Take 1 tablet by mouth every 6 (six) hours as needed for severe pain. Patient not taking: Reported on 10/06/2015 12/12/14   Antony Madura, PA-C   Family History Family History  Problem Relation Age of Onset  . Hypertension Mother   . Cancer Maternal Aunt   . Diabetes Maternal Aunt   . Hypertension Maternal Aunt    Social History Social History  Substance Use Topics  . Smoking status: Never Smoker  . Smokeless tobacco: Never Used  . Alcohol use 2.4 oz/week    4 Shots of liquor per week     Comment: drinking increased since HIV diagnosis    Allergies   Review of patient's allergies indicates no known allergies.  Review of Systems Review of Systems  Gastrointestinal: Positive for abdominal pain, nausea, rectal pain and vomiting. Negative for diarrhea.  Allergic/Immunologic: Positive for immunocompromised state.  All other systems reviewed and are negative.  Physical Exam Updated Vital Signs BP 131/70 (BP Location: Right Arm)   Pulse 92   Temp 98.4 F (36.9 C) (Oral)   Resp 18   Ht 5\' 4"  (1.626 m)  Wt 173 lb (78.5 kg)   SpO2 100%   BMI 29.70 kg/m   Physical Exam  Constitutional: He is oriented to person, place, and time. He appears well-developed and well-nourished. No distress.  HENT:  Head: Normocephalic and atraumatic.  Right Ear: Hearing normal.  Left Ear: Hearing normal.  Nose: Nose normal.  Mouth/Throat: Oropharynx is clear and moist and mucous membranes are normal.  Eyes: Conjunctivae and EOM  are normal. Pupils are equal, round, and reactive to light.  Neck: Normal range of motion. Neck supple.  Cardiovascular: Regular rhythm, S1 normal and S2 normal.  Exam reveals no gallop and no friction rub.   No murmur heard. Pulmonary/Chest: Effort normal and breath sounds normal. No respiratory distress. He exhibits no tenderness.  Abdominal: Soft. Normal appearance and bowel sounds are normal. There is no hepatosplenomegaly. There is no tenderness. There is no rebound, no guarding, no tenderness at McBurney's point and negative Murphy's sign. No hernia.  Genitourinary:  Genitourinary Comments: Chaperone present throughout entire exam. 0.5cm non-thrombosed hemorrhoid on the right side.   Musculoskeletal: Normal range of motion.  Neurological: He is alert and oriented to person, place, and time. He has normal strength. No cranial nerve deficit or sensory deficit. Coordination normal. GCS eye subscore is 4. GCS verbal subscore is 5. GCS motor subscore is 6.  Skin: Skin is warm, dry and intact. No rash noted. No cyanosis.  Psychiatric: He has a normal mood and affect. His speech is normal and behavior is normal. Thought content normal.  Nursing note and vitals reviewed.  ED Treatments / Results  DIAGNOSTIC STUDIES: Oxygen Saturation is 97% on RA, normal by my interpretation.   COORDINATION OF CARE: 11:32 PM-Discussed next steps with pt. Pt verbalized understanding and is agreeable with the plan.   Labs (all labs ordered are listed, but only abnormal results are displayed) Labs Reviewed  CBC WITH DIFFERENTIAL/PLATELET - Abnormal; Notable for the following:       Result Value   Hemoglobin 12.5 (*)    HCT 38.1 (*)    All other components within normal limits  COMPREHENSIVE METABOLIC PANEL - Abnormal; Notable for the following:    ALT 16 (*)    All other components within normal limits  URINALYSIS, ROUTINE W REFLEX MICROSCOPIC (NOT AT [facility])  LIPASE, BLOOD    EKG  EKG  Interpretation None      Radiology Dg Abd Acute W/chest  Result Date: 01/21/2016 CLINICAL DATA:  Nausea, vomiting and excessive gas for 5-6 days EXAM: DG ABDOMEN ACUTE W/ 1V CHEST COMPARISON:  03/07/2011 FINDINGS: PA view chest demonstrates a left nipple ring. No acute infiltrate or effusion. Cardiomediastinal silhouette is normal. Supine and upright views of the abdomen demonstrate no free air beneath the diaphragm. Nonspecific, nonobstructed bowel gas pattern. No pathologic calcifications. IMPRESSION: Negative abdominal radiographs.  No acute cardiopulmonary disease. Electronically Signed   By: Jasmine Pang M.D.   On: 01/21/2016 02:18    Procedures Procedures   Medications Ordered in ED Medications  sodium chloride 0.9 % bolus 1,000 mL (1,000 mLs Intravenous New Bag/Given 01/21/16 0037)  ondansetron (ZOFRAN) injection 4 mg (4 mg Intravenous Given 01/21/16 0037)    Initial Impression / Assessment and Plan / ED Course  I have reviewed the triage vital signs and the nursing notes.  Pertinent labs & imaging results that were available during my care of the patient were reviewed by me and considered in my medical decision making (see chart for details).  Clinical Course  Patient presents to the emergency department for evaluation of nausea and vomiting ongoing for 5 or 6 days. Patient reports that symptoms are improving but he is still feeling nauseated. He has not vomited since yesterday. Patient initially had some cramping pain in his abdomen but this has resolved. Abdominal exam is benign. Patient's lab work is entirely normal. Normal lipase, normal LFTs, normal white blood cell count. Urinalysis clear. Plain film x-ray normal. Serial abdominal exams continued to be benign. Patient is to IV fluids and medication, is appropriate for discharge and follow-up with primary doctor.  Examination revealed small nonthrombosed hemorrhoid. Will treat topically with Anusol HC.  Final Clinical  Impressions(s) / ED Diagnoses   Final diagnoses:  External hemorrhoid  Non-intractable vomiting without nausea, unspecified vomiting type   New Prescriptions New Prescriptions   No medications on file   I personally performed the services described in this documentation, which was scribed in my presence. The recorded information has been reviewed and is accurate.     Gilda Creasehristopher J Sherod Cisse, MD 01/21/16 [phone]

## 2016-01-20 NOTE — ED Triage Notes (Signed)
Pt is c/o excessive gas, nausea, vomiting, and heartburn  Pt states last vomited last night  Pt states his sxs started 5 to 6 days ago  Pt also thinks he has a hemorrhoid   Pt denies abd pain

## 2016-01-21 ENCOUNTER — Encounter: Payer: Self-pay | Admitting: Internal Medicine

## 2016-01-21 ENCOUNTER — Ambulatory Visit (INDEPENDENT_AMBULATORY_CARE_PROVIDER_SITE_OTHER): Payer: Self-pay | Admitting: Internal Medicine

## 2016-01-21 ENCOUNTER — Emergency Department (HOSPITAL_COMMUNITY): Payer: Self-pay

## 2016-01-21 DIAGNOSIS — Z23 Encounter for immunization: Secondary | ICD-10-CM

## 2016-01-21 DIAGNOSIS — F1910 Other psychoactive substance abuse, uncomplicated: Secondary | ICD-10-CM

## 2016-01-21 DIAGNOSIS — F411 Generalized anxiety disorder: Secondary | ICD-10-CM

## 2016-01-21 DIAGNOSIS — B20 Human immunodeficiency virus [HIV] disease: Secondary | ICD-10-CM

## 2016-01-21 LAB — CBC WITH DIFFERENTIAL/PLATELET
BASOS ABS: 0 10*3/uL (ref 0.0–0.1)
BASOS PCT: 1 %
EOS ABS: 0.5 10*3/uL (ref 0.0–0.7)
EOS PCT: 7 %
HCT: 38.1 % — ABNORMAL LOW (ref 39.0–52.0)
Hemoglobin: 12.5 g/dL — ABNORMAL LOW (ref 13.0–17.0)
Lymphocytes Relative: 45 %
Lymphs Abs: 3.5 10*3/uL (ref 0.7–4.0)
MCH: 29.3 pg (ref 26.0–34.0)
MCHC: 32.8 g/dL (ref 30.0–36.0)
MCV: 89.4 fL (ref 78.0–100.0)
MONO ABS: 0.4 10*3/uL (ref 0.1–1.0)
Monocytes Relative: 6 %
NEUTROS ABS: 3.1 10*3/uL (ref 1.7–7.7)
Neutrophils Relative %: 41 %
PLATELETS: 315 10*3/uL (ref 150–400)
RBC: 4.26 MIL/uL (ref 4.22–5.81)
RDW: 13.5 % (ref 11.5–15.5)
WBC: 7.6 10*3/uL (ref 4.0–10.5)

## 2016-01-21 LAB — COMPREHENSIVE METABOLIC PANEL
ALBUMIN: 4.7 g/dL (ref 3.5–5.0)
ALT: 16 U/L — ABNORMAL LOW (ref 17–63)
AST: 25 U/L (ref 15–41)
Alkaline Phosphatase: 45 U/L (ref 38–126)
Anion gap: 8 (ref 5–15)
BUN: 11 mg/dL (ref 6–20)
CHLORIDE: 101 mmol/L (ref 101–111)
CO2: 28 mmol/L (ref 22–32)
Calcium: 9.6 mg/dL (ref 8.9–10.3)
Creatinine, Ser: 1.24 mg/dL (ref 0.61–1.24)
GFR calc Af Amer: >60 mL/min (ref >60)
GFR calc non Af Amer: >60 mL/min (ref >60)
GLUCOSE: 93 mg/dL (ref 65–99)
POTASSIUM: 3.8 mmol/L (ref 3.5–5.1)
SODIUM: 137 mmol/L (ref 135–145)
Total Bilirubin: 0.4 mg/dL (ref 0.3–1.2)
Total Protein: 7.8 g/dL (ref 6.5–8.1)

## 2016-01-21 LAB — URINALYSIS, ROUTINE W REFLEX MICROSCOPIC
Bilirubin Urine: NEGATIVE
GLUCOSE, UA: NEGATIVE mg/dL
HGB URINE DIPSTICK: NEGATIVE
Ketones, ur: NEGATIVE mg/dL
LEUKOCYTES UA: NEGATIVE
Nitrite: NEGATIVE
Protein, ur: NEGATIVE mg/dL
SPECIFIC GRAVITY, URINE: 1.016 (ref 1.005–1.030)
pH: 5.5 (ref 5.0–8.0)

## 2016-01-21 LAB — LIPASE, BLOOD: LIPASE: 27 U/L (ref 11–51)

## 2016-01-21 MED ORDER — HYDROCORTISONE 2.5 % RE CREA: TOPICAL_CREAM | RECTAL | 0 refills | Status: DC

## 2016-01-21 MED ORDER — PROMETHAZINE HCL 25 MG PO TABS: 25.0000 mg | ORAL_TABLET | Freq: Four times a day (QID) | ORAL | 0 refills | Status: DC | PRN

## 2016-01-21 NOTE — Assessment & Plan Note (Signed)
His adherence is excellent. He had a little bump in his viral load but he has had complete CD4 reconstitution back normal and his infection is under good control. He will continue Triumeq and follow-up after lab work in 3 months. He received his influenza vaccination today.

## 2016-01-21 NOTE — Assessment & Plan Note (Signed)
His chronic anxiety has improved somewhat but he remains mildly depressed. Both of these are very chronic issues. He believes that his improvement is related to getting over the shock of his HIV diagnosis. I did encourage him to go through with this plan to start getting regular exercise.

## 2016-01-21 NOTE — Progress Notes (Signed)
Patient Active Problem List   Diagnosis Date Noted  . HIV disease (HCC) 07/22/2015    Priority: High  . Dyslipidemia 07/22/2015  . Elevated liver enzymes 07/22/2015  . Generalized anxiety disorder 07/22/2015  . Polysubstance abuse 07/22/2015  . Attention deficit hyperactivity disorder (ADHD) 07/14/2015  . Perirectal cyst 07/14/2015  . ALLERGIC RHINITIS 11/22/2007    Patient's Medications  New Prescriptions   No medications on file  Previous Medications   ABACAVIR-DOLUTEGRAVIR-LAMIVUDINE (TRIUMEQ) 600-50-300 MG TABLET    Take 1 tablet by mouth daily.   AMPHETAMINE-DEXTROAMPHETAMINE (ADDERALL) 30 MG TABLET    Take 30 mg by mouth daily.   CLONAZEPAM (KLONOPIN) 1 MG TABLET    Take 1 mg by mouth daily as needed for anxiety.    HYDROCODONE-ACETAMINOPHEN (NORCO/VICODIN) 5-325 MG PER TABLET    Take 1 tablet by mouth every 6 (six) hours as needed for severe pain.   HYDROCORTISONE (ANUSOL-HC) 2.5 % RECTAL CREAM    Apply rectally 2 times daily   IBUPROFEN (ADVIL,MOTRIN) 200 MG TABLET    Take 800 mg by mouth every 6 (six) hours as needed for headache, mild pain or moderate pain. Reported on 07/14/2015   PROMETHAZINE (PHENERGAN) 25 MG TABLET    Take 1 tablet (25 mg total) by mouth every 6 (six) hours as needed for nausea or vomiting.  Modified Medications   No medications on file  Discontinued Medications   No medications on file    Subjective: Jeremy Gallagher is in for his routine HIV follow-up visit. He has had no problems obtaining, taking or tolerating his Triumeq. He takes it each morning when he wakes up between 11 and noon. He has an alarm clock set to remind him. He is missed only one dose of Triumeq since starting earlier this year. He is currently not working. 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 He is no longer in a relationship with his friend, Mitchel Honour. He tells me that Mitchel Honour wanted to have unprotected sex and he  refused. He is currently not in a relationship and has not been sexually active. He now only drinks alcohol occasionally and only socially. He has not used any marijuana or cocaine. He feels like his chronic anxiety is slightly improved. He still has days where he is depressed but states this is fairly stable over the past several months. He still sees Deitra Mayo the counselor at Dr. Minerva Areola Dean's office.  Review of Systems: Review of Systems  Constitutional: Positive for malaise/fatigue. Negative for chills, diaphoresis, fever and weight loss.       He is considering starting walking and swimming. He has thought about getting a membership at [employer]. He currently gets no regular exercise.  HENT: Negative for sore throat.   Respiratory: Negative for cough, sputum production and shortness of breath.   Cardiovascular: Negative for chest pain.  Gastrointestinal: Negative for abdominal pain, diarrhea, heartburn, nausea and vomiting.       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  Musculoskeletal: Negative for joint pain and myalgias.  Skin: Negative for rash.  Neurological: Negative for dizziness and headaches.  Psychiatric/Behavioral: Positive for depression. Negative for substance abuse. The patient is nervous/anxious.     Past Medical History:  Diagnosis Date  . Allergy   . Anxiety   . Depression   . HIV infection (HCC)  Social History  Substance Use Topics  . Smoking status: Never Smoker  . Smokeless tobacco: Never Used  . Alcohol use 2.4 oz/week    4 Shots of liquor per week     Comment: drinking increased since HIV diagnosis     Family History  Problem Relation Age of Onset  . Hypertension Mother   . Cancer Maternal Aunt   . Diabetes Maternal Aunt   . Hypertension Maternal Aunt     No Known Allergies  Objective:  Vitals:   01/21/16 1433  BP: (!) 141/83  Temp: 98.3 F (36.8 C)  TempSrc: Oral  Weight: 163 lb (73.9  kg)  Height: 5\' 4"  (1.626 m)   Body mass index is 27.98 kg/m.  Physical Exam  Constitutional: He is oriented to person, place, and time.  He is a little groggy today.  HENT:  Mouth/Throat: No oropharyngeal exudate.  Eyes: Conjunctivae are normal.  Cardiovascular: Normal rate and regular rhythm.   No murmur heard. Pulmonary/Chest: Effort normal and breath sounds normal. He has no wheezes. He has no rales.  Abdominal: Soft. He exhibits no mass. There is no tenderness.  Musculoskeletal: Normal range of motion. He exhibits no edema or tenderness.  Neurological: He is alert and oriented to person, place, and time.  Skin: No rash noted.  Psychiatric: Mood and affect normal.    Lab Results Lab Results  Component Value Date   WBC 7.6 01/21/2016   HGB 12.5 (L) 01/21/2016   HCT 38.1 (L) 01/21/2016   MCV 89.4 01/21/2016   PLT 315 01/21/2016    Lab Results  Component Value Date   CREATININE 1.24 01/21/2016   BUN 11 01/21/2016   NA 137 01/21/2016   K 3.8 01/21/2016   CL 101 01/21/2016   CO2 28 01/21/2016    Lab Results  Component Value Date   ALT 16 (L) 01/21/2016   AST 25 01/21/2016   ALKPHOS 45 01/21/2016   BILITOT 0.4 01/21/2016    Lab Results  Component Value Date   CHOL 227 (H) 07/07/2015   HDL 32 (L) 07/07/2015   LDLCALC 124 07/07/2015   TRIG 353 (H) 07/07/2015   CHOLHDL 7.1 (H) 07/07/2015   HIV 1 RNA Quant (copies/mL)  Date Value  11/30/2015 47 (H)  09/02/2015 <20  07/07/2015 37,861 (H)   CD4 T Cell Abs (/uL)  Date Value  11/30/2015 410  09/02/2015 360 (L)  07/07/2015 260 (L)     Problem List Items Addressed This Visit      High   HIV disease (HCC)    His adherence is excellent. He had a little bump in his viral load but he has had complete CD4 reconstitution back normal and his infection is under good control. He will continue Triumeq and follow-up after lab work in 3 months. He received his influenza vaccination today.      Relevant Orders    T-helper cell (CD4)- ([facility] clinic only)   HIV 1 RNA quant-no reflex-bld   CBC   Comprehensive metabolic panel   Lipid panel   RPR     Unprioritized   Generalized anxiety disorder    His chronic anxiety has improved somewhat but he remains mildly depressed. Both of these are very chronic issues. He believes that his improvement is related to getting over the shock of his HIV diagnosis. I did encourage him to go through with this plan to start getting regular exercise.      Polysubstance abuse    He  is doing much better. He is no longer smoking marijuana and he has decreased the amount of alcohol intake dramatically.       Other Visit Diagnoses   None.       Cliffton Asters, MD [facility] for Infectious Disease [facility] [phone] pager   [phone] cell 01/21/2016, 3:07 PM

## 2016-01-21 NOTE — Assessment & Plan Note (Signed)
He is doing much better. He is no longer smoking marijuana and he has decreased the amount of alcohol intake dramatically.

## 2016-04-06 ENCOUNTER — Ambulatory Visit: Payer: Self-pay

## 2016-04-07 ENCOUNTER — Ambulatory Visit: Payer: Self-pay

## 2016-04-19 ENCOUNTER — Ambulatory Visit: Payer: Self-pay

## 2016-04-19 ENCOUNTER — Other Ambulatory Visit (INDEPENDENT_AMBULATORY_CARE_PROVIDER_SITE_OTHER): Payer: Self-pay

## 2016-04-19 DIAGNOSIS — Z79899 Other long term (current) drug therapy: Secondary | ICD-10-CM

## 2016-04-19 DIAGNOSIS — B20 Human immunodeficiency virus [HIV] disease: Secondary | ICD-10-CM

## 2016-04-19 DIAGNOSIS — Z113 Encounter for screening for infections with a predominantly sexual mode of transmission: Secondary | ICD-10-CM

## 2016-04-20 LAB — COMPREHENSIVE METABOLIC PANEL
ALK PHOS: 52 U/L (ref 40–115)
ALT: 12 U/L (ref 9–46)
AST: 22 U/L (ref 10–40)
Albumin: 4.8 g/dL (ref 3.6–5.1)
BUN: 12 mg/dL (ref 7–25)
CALCIUM: 10.3 mg/dL (ref 8.6–10.3)
CO2: 29 mmol/L (ref 20–31)
Chloride: 101 mmol/L (ref 98–110)
Creat: 0.85 mg/dL (ref 0.60–1.35)
GLUCOSE: 81 mg/dL (ref 65–99)
POTASSIUM: 4.1 mmol/L (ref 3.5–5.3)
Sodium: 138 mmol/L (ref 135–146)
Total Bilirubin: 0.2 mg/dL (ref 0.2–1.2)
Total Protein: 7.5 g/dL (ref 6.1–8.1)

## 2016-04-20 LAB — CBC
HEMATOCRIT: 38 % — AB (ref 38.5–50.0)
Hemoglobin: 12 g/dL — ABNORMAL LOW (ref 13.2–17.1)
MCH: 28 pg (ref 27.0–33.0)
MCHC: 31.6 g/dL — AB (ref 32.0–36.0)
MCV: 88.8 fL (ref 80.0–100.0)
MPV: 8.8 fL (ref 7.5–12.5)
Platelets: 301 10*3/uL (ref 140–400)
RBC: 4.28 MIL/uL (ref 4.20–5.80)
RDW: 15.7 % — ABNORMAL HIGH (ref 11.0–15.0)
WBC: 7 10*3/uL (ref 3.8–10.8)

## 2016-04-20 LAB — LIPID PANEL
CHOL/HDL RATIO: 3.3 ratio (ref <5.0)
Cholesterol: 143 mg/dL (ref <200)
HDL: 43 mg/dL (ref >40)
LDL Cholesterol: 82 mg/dL (ref <100)
TRIGLYCERIDES: 92 mg/dL (ref <150)
VLDL: 18 mg/dL (ref <30)

## 2016-04-20 LAB — RPR

## 2016-04-20 LAB — T-HELPER CELL (CD4) - (RCID CLINIC ONLY)
CD4 % Helper T Cell: 19 % — ABNORMAL LOW (ref 33–55)
CD4 T CELL ABS: 560 /uL (ref 400–2700)

## 2016-04-21 ENCOUNTER — Encounter: Payer: Self-pay | Admitting: Internal Medicine

## 2016-04-21 LAB — HIV-1 RNA QUANT-NO REFLEX-BLD
HIV 1 RNA Quant: <20 copies/mL (ref <20)
HIV-1 RNA Quant, Log: <1.3 Log copies/mL (ref <1.30)

## 2016-05-03 ENCOUNTER — Ambulatory Visit: Payer: Self-pay | Admitting: Internal Medicine

## 2016-07-05 ENCOUNTER — Other Ambulatory Visit (INDEPENDENT_AMBULATORY_CARE_PROVIDER_SITE_OTHER): Payer: Self-pay

## 2016-07-05 DIAGNOSIS — B20 Human immunodeficiency virus [HIV] disease: Secondary | ICD-10-CM

## 2016-07-05 DIAGNOSIS — Z113 Encounter for screening for infections with a predominantly sexual mode of transmission: Secondary | ICD-10-CM

## 2016-07-05 LAB — COMPLETE METABOLIC PANEL WITH GFR
ALBUMIN: 5.4 g/dL — AB (ref 3.6–5.1)
ALT: 13 U/L (ref 9–46)
AST: 16 U/L (ref 10–40)
Alkaline Phosphatase: 57 U/L (ref 40–115)
BUN: 12 mg/dL (ref 7–25)
CALCIUM: 10.4 mg/dL — AB (ref 8.6–10.3)
CHLORIDE: 106 mmol/L (ref 98–110)
CO2: 26 mmol/L (ref 20–31)
CREATININE: 1.16 mg/dL (ref 0.60–1.35)
GFR, Est African American: >89 mL/min (ref >=60)
GFR, Est Non African American: 87 mL/min (ref >=60)
GLUCOSE: 74 mg/dL (ref 65–99)
POTASSIUM: 3.6 mmol/L (ref 3.5–5.3)
SODIUM: 143 mmol/L (ref 135–146)
Total Bilirubin: 0.6 mg/dL (ref 0.2–1.2)
Total Protein: 7.8 g/dL (ref 6.1–8.1)

## 2016-07-05 LAB — CBC WITH DIFFERENTIAL/PLATELET
BASOS ABS: 0 {cells}/uL (ref 0–200)
Basophils Relative: 0 %
Eosinophils Absolute: 72 cells/uL (ref 15–500)
Eosinophils Relative: 1 %
HCT: 40 % (ref 38.5–50.0)
Hemoglobin: 13.1 g/dL — ABNORMAL LOW (ref 13.2–17.1)
LYMPHS ABS: 3096 {cells}/uL (ref 850–3900)
LYMPHS PCT: 43 %
MCH: 29.1 pg (ref 27.0–33.0)
MCHC: 32.8 g/dL (ref 32.0–36.0)
MCV: 88.9 fL (ref 80.0–100.0)
MONO ABS: 432 {cells}/uL (ref 200–950)
MPV: 9.1 fL (ref 7.5–12.5)
Monocytes Relative: 6 %
NEUTROS PCT: 50 %
Neutro Abs: 3600 cells/uL (ref 1500–7800)
Platelets: 293 10*3/uL (ref 140–400)
RBC: 4.5 MIL/uL (ref 4.20–5.80)
RDW: 15.8 % — AB (ref 11.0–15.0)
WBC: 7.2 10*3/uL (ref 3.8–10.8)

## 2016-07-06 LAB — T-HELPER CELL (CD4) - (RCID CLINIC ONLY)
CD4 % Helper T Cell: 17 % — ABNORMAL LOW (ref 33–55)
CD4 T CELL ABS: 550 /uL (ref 400–2700)

## 2016-07-07 LAB — HIV-1 RNA QUANT-NO REFLEX-BLD
HIV 1 RNA Quant: <20 copies/mL
HIV-1 RNA Quant, Log: <1.3 Log copies/mL

## 2016-07-19 ENCOUNTER — Encounter: Payer: Self-pay | Admitting: Internal Medicine

## 2016-07-19 ENCOUNTER — Ambulatory Visit (INDEPENDENT_AMBULATORY_CARE_PROVIDER_SITE_OTHER): Payer: Self-pay | Admitting: Internal Medicine

## 2016-07-19 ENCOUNTER — Other Ambulatory Visit: Payer: Self-pay | Admitting: Internal Medicine

## 2016-07-19 VITALS — Ht 64.0 in | Wt 163.0 lb

## 2016-07-19 DIAGNOSIS — Z23 Encounter for immunization: Secondary | ICD-10-CM

## 2016-07-19 DIAGNOSIS — B20 Human immunodeficiency virus [HIV] disease: Secondary | ICD-10-CM

## 2016-07-19 NOTE — Assessment & Plan Note (Signed)
His infection has come under excellent control. His adherence is very good. He will follow-up after lab work in 6 months.

## 2016-07-19 NOTE — Addendum Note (Signed)
Addended by: Andree Coss on: 07/19/2016 05:13 PM   Modules accepted: Orders

## 2016-07-19 NOTE — Progress Notes (Signed)
Patient Active Problem List   Diagnosis Date Noted  . HIV disease (HCC) 07/22/2015    Priority: High  . Dyslipidemia 07/22/2015  . Elevated liver enzymes 07/22/2015  . Generalized anxiety disorder 07/22/2015  . Polysubstance abuse 07/22/2015  . Attention deficit hyperactivity disorder (ADHD) 07/14/2015  . Perirectal cyst 07/14/2015  . ALLERGIC RHINITIS 11/22/2007    Patient's Medications  New Prescriptions   No medications on file  Previous Medications   ABACAVIR-DOLUTEGRAVIR-LAMIVUDINE (TRIUMEQ) 600-50-300 MG TABLET    Take 1 tablet by mouth daily.   AMPHETAMINE-DEXTROAMPHETAMINE (ADDERALL) 30 MG TABLET    Take 30 mg by mouth daily.   CLONAZEPAM (KLONOPIN) 1 MG TABLET    Take 1 mg by mouth daily as needed for anxiety.    HYDROCODONE-ACETAMINOPHEN (NORCO/VICODIN) 5-325 MG PER TABLET    Take 1 tablet by mouth every 6 (six) hours as needed for severe pain.   HYDROCORTISONE (ANUSOL-HC) 2.5 % RECTAL CREAM    Apply rectally 2 times daily   IBUPROFEN (ADVIL,MOTRIN) 200 MG TABLET    Take 800 mg by mouth every 6 (six) hours as needed for headache, mild pain or moderate pain. Reported on 07/14/2015   PROMETHAZINE (PHENERGAN) 25 MG TABLET    Take 1 tablet (25 mg total) by mouth every 6 (six) hours as needed for nausea or vomiting.  Modified Medications   No medications on file  Discontinued Medications   No medications on file    Subjective: Jeremy Gallagher is in for his routine HIV follow-up visit. He denies missing any doses of his Triumeq since his last visit. 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 He is enjoying his work. He takes his Triumeq each day at noon while he is at work. He continues to see his counselor at Dr. Moody Bruins office. He is not on any new medications.  Review of Systems: Review of Systems  Constitutional: Negative for chills, diaphoresis, fever, malaise/fatigue and weight loss.  HENT: Negative for sore throat.   Respiratory: Negative for cough,  sputum production and shortness of breath.   Cardiovascular: Negative for chest pain.  Gastrointestinal: Negative for abdominal pain, diarrhea, heartburn, nausea and vomiting.  Genitourinary: Negative for dysuria and frequency.  Musculoskeletal: Negative for joint pain and myalgias.  Skin: Negative for rash.  Neurological: Negative for dizziness and headaches.  Psychiatric/Behavioral: Negative for depression and substance abuse. The patient is not nervous/anxious.     Past Medical History:  Diagnosis Date  . Allergy   . Anxiety   . Depression   . HIV infection [facility])     Social History  Substance Use Topics  . Smoking status: Never Smoker  . Smokeless tobacco: Never Used  . Alcohol use 2.4 oz/week    4 Shots of liquor per week     Comment: drinking increased since HIV diagnosis     Family History  Problem Relation Age of Onset  . Hypertension Mother   . Cancer Maternal Aunt   . Diabetes Maternal Aunt   . Hypertension Maternal Aunt     No Known Allergies  Objective:  Vitals:   07/19/16 1454  Weight: 163 lb (73.9 kg)   Body mass index is 27.98 kg/m.  Physical Exam  Constitutional: He is oriented to person, place, and time.  He is accompanied today by his boyfriend, Jeremy Gallagher.  HENT:  Mouth/Throat: No oropharyngeal exudate.  Eyes: Conjunctivae are normal.  Cardiovascular: Normal rate and regular rhythm.  No murmur heard. Pulmonary/Chest: Effort normal and breath sounds normal.  Abdominal: Soft. He exhibits no mass. There is no tenderness.  Musculoskeletal: Normal range of motion.  Neurological: He is alert and oriented to person, place, and time.  Skin: No rash noted.  Psychiatric: Mood and affect normal.    Lab Results Lab Results  Component Value Date   WBC 7.2 07/05/2016   HGB 13.1 (L) 07/05/2016   HCT 40.0 07/05/2016   MCV 88.9 07/05/2016   PLT 293 07/05/2016    Lab Results  Component Value Date   CREATININE 1.16 07/05/2016   BUN 12 07/05/2016     NA 143 07/05/2016   K 3.6 07/05/2016   CL 106 07/05/2016   CO2 26 07/05/2016    Lab Results  Component Value Date   ALT 13 07/05/2016   AST 16 07/05/2016   ALKPHOS 57 07/05/2016   BILITOT 0.6 07/05/2016    Lab Results  Component Value Date   CHOL 143 04/19/2016   HDL 43 04/19/2016   LDLCALC 82 04/19/2016   TRIG 92 04/19/2016   CHOLHDL 3.3 04/19/2016   HIV 1 RNA Quant (copies/mL)  Date Value  07/05/2016 <20 NOT DETECTED  04/19/2016 <20  11/30/2015 47 (H)   CD4 T Cell Abs (/uL)  Date Value  07/05/2016 550  04/19/2016 560  11/30/2015 410     Problem List Items Addressed This Visit      High   HIV disease (HCC)    His infection has come under excellent control. His adherence is very good. He will follow-up after lab work in 6 months.           Cliffton Asters, MD [facility] for Infectious Disease [facility] [phone] pager   [phone] cell 07/19/2016, 3:14 PM

## 2016-09-27 ENCOUNTER — Encounter (HOSPITAL_COMMUNITY): Payer: Self-pay | Admitting: Emergency Medicine

## 2016-09-27 ENCOUNTER — Emergency Department (HOSPITAL_COMMUNITY)
Admission: EM | Admit: 2016-09-27 | Discharge: 2016-09-27 | Disposition: A | Discharge status: Home / Self Care | Payer: Self-pay | Attending: Emergency Medicine | Admitting: Emergency Medicine

## 2016-09-27 DIAGNOSIS — Z79899 Other long term (current) drug therapy: Secondary | ICD-10-CM | POA: Insufficient documentation

## 2016-09-27 DIAGNOSIS — Z21 Asymptomatic human immunodeficiency virus [HIV] infection status: Secondary | ICD-10-CM | POA: Insufficient documentation

## 2016-09-27 DIAGNOSIS — K0889 Other specified disorders of teeth and supporting structures: Secondary | ICD-10-CM | POA: Insufficient documentation

## 2016-09-27 DIAGNOSIS — F909 Attention-deficit hyperactivity disorder, unspecified type: Secondary | ICD-10-CM | POA: Insufficient documentation

## 2016-09-27 MED ORDER — NAPROXEN 500 MG PO TABS
500.0000 mg | ORAL_TABLET | Freq: Once | ORAL | Status: AC
  Administered 2016-09-27: 500 mg via ORAL
  Filled 2016-09-27: qty 1

## 2016-09-27 MED ORDER — NAPROXEN 500 MG PO TABS: 500.0000 mg | ORAL_TABLET | Freq: Two times a day (BID) | ORAL | 0 refills | Status: DC

## 2016-09-27 MED ORDER — PENICILLIN V POTASSIUM 500 MG PO TABS: 500.0000 mg | ORAL_TABLET | Freq: Two times a day (BID) | ORAL | 0 refills | Status: DC

## 2016-09-27 NOTE — ED Triage Notes (Signed)
Patient c/o right upper dental pain x1 week. Reports taking tylenol an hour ago.

## 2016-09-27 NOTE — ED Provider Notes (Signed)
[facility]-EMERGENCY DEPT Provider Note   CSN: [id number] Arrival date & time: 09/27/16  1211  By signing my name below, I, Diona BrownerJennifer Gorman, attest that this documentation has been prepared under the direction and in the presence of Melburn HakeNicole Barbara Keng, PA-C. Electronically Signed: Diona BrownerJennifer Gorman, ED Scribe. 09/27/16. 12:43 PM.  History   Chief Complaint Chief Complaint  Patient presents with  . Dental Pain    HPI Jeremy Gallagher is a 26 y.o. male who presents to the Emergency Department complaining of right upper dental pain for the last couple of months, gradually worsening last night. Started as a cavity that has now rotted. Associated sx include nausea. Pt took Advil liquid gels an hour ago with mild to no relief. Pt doesn't have a dentist. Pt denies drainage, vomiting, fever, facial swelling, SOB, trismus and trouble swallowing.  The history is provided by the patient. No language interpreter was used.    Past Medical History:  Diagnosis Date  . Allergy   . Anxiety   . Depression   . HIV infection [facility](HCC)     Patient Active Problem List   Diagnosis Date Noted  . HIV disease (HCC) 07/22/2015  . Dyslipidemia 07/22/2015  . Elevated liver enzymes 07/22/2015  . Generalized anxiety disorder 07/22/2015  . Polysubstance abuse 07/22/2015  . Attention deficit hyperactivity disorder (ADHD) 07/14/2015  . Perirectal cyst 07/14/2015  . ALLERGIC RHINITIS 11/22/2007    Past Surgical History:  Procedure Laterality Date  . WISDOM TOOTH EXTRACTION         Home Medications    Prior to Admission medications   Medication Sig Start Date End Date Taking? Authorizing Provider  amphetamine-dextroamphetamine (ADDERALL) 30 MG tablet Take 30 mg by mouth daily.    [provider]  clonazePAM (KLONOPIN) 1 MG tablet Take 1 mg by mouth daily as needed for anxiety.  08/21/14   [provider]  ibuprofen (ADVIL,MOTRIN) 200 MG tablet Take 800 mg by mouth every 6 (six) hours as needed for  headache, mild pain or moderate pain. Reported on 07/14/2015    [provider]  naproxen (NAPROSYN) 500 MG tablet Take 1 tablet (500 mg total) by mouth 2 (two) times daily. 09/27/16   Barrett HenleNadeau, Niaya Hickok Elizabeth, PA-C  penicillin v potassium (VEETID) 500 MG tablet Take 1 tablet (500 mg total) by mouth 2 (two) times daily. 09/27/16   Barrett HenleNadeau, Ashrita Chrismer Elizabeth, PA-C  TRIUMEQ 600-50-300 MG tablet TAKE 1 TABLET BY MOUTH DAILY 07/19/16   Cliffton Astersampbell, [name], MD    Family History Family History  Problem Relation Age of Onset  . Hypertension Mother   . Cancer Maternal Aunt   . Diabetes Maternal Aunt   . Hypertension Maternal Aunt     Social History Social History  Substance Use Topics  . Smoking status: Never Smoker  . Smokeless tobacco: Never Used  . Alcohol use 2.4 oz/week    4 Shots of liquor per week     Comment: drinking increased since HIV diagnosis      Allergies   Patient has no known allergies.   Review of Systems Review of Systems  Constitutional: Negative for fever.  HENT: Positive for dental problem. Negative for facial swelling and trouble swallowing.   Respiratory: Negative for shortness of breath.   Gastrointestinal: Positive for nausea. Negative for vomiting.     Physical Exam Updated Vital Signs BP (!) 150/98 (BP Location: Left Arm)   Pulse 99   Temp 100 F (37.8 C) (Oral)   Resp 16  Ht 5\' 4"  (1.626 m)   Wt 74.8 kg (165 lb)   SpO2 97%   BMI 28.32 kg/m   Physical Exam  Constitutional: He is oriented to person, place, and time. He appears well-developed and well-nourished.  HENT:  Head: Normocephalic and atraumatic.  Mouth/Throat: Uvula is midline, oropharynx is clear and moist and mucous membranes are normal. No oral lesions. No trismus in the jaw. Abnormal dentition. Dental caries present. No dental abscesses, uvula swelling or lacerations. No oropharyngeal exudate, posterior oropharyngeal edema, posterior oropharyngeal erythema or tonsillar abscesses.  No tonsillar exudate.    No trismus, drooling, facial/neck swelling or stridor on exam. No muffled voice. Floor of mouth soft.  No facial or neck swelling.  Eyes: Conjunctivae and EOM are normal. Right eye exhibits no discharge. Left eye exhibits no discharge. No scleral icterus.  Neck: Normal range of motion. Neck supple.  Cardiovascular: Normal rate.   Pulmonary/Chest: Effort normal.  Neurological: He is alert and oriented to person, place, and time.  Skin: Skin is warm and dry.  Nursing note and vitals reviewed.    ED Treatments / Results  DIAGNOSTIC STUDIES: Oxygen Saturation is 97% on RA, normal by my interpretation.   COORDINATION OF CARE: 12:43 PM-Discussed next steps with pt which includes taking pain medication and an antibiotic. Pt is to follow up with a dentist. Pt verbalized understanding and is agreeable with the plan.   Labs (all labs ordered are listed, but only abnormal results are displayed) Labs Reviewed - No data to display  EKG  EKG Interpretation None       Radiology No results found.  Procedures Procedures (including critical care time)  Medications Ordered in ED Medications  naproxen (NAPROSYN) tablet 500 mg (500 mg Oral Given 09/27/16 1249)     Initial Impression / Assessment and Plan / ED Course  I have reviewed the triage vital signs and the nursing notes.  Pertinent labs & imaging results that were available during my care of the patient were reviewed by me and considered in my medical decision making (see chart for details).     Patient with toothache.  No gross abscess.  Exam unconcerning for Ludwig's angina or spread of infection.  Will treat with penicillin and pain medicine.  Urged patient to follow-up with dentist.     Final Clinical Impressions(s) / ED Diagnoses   Final diagnoses:  Pain, dental    New Prescriptions New Prescriptions   NAPROXEN (NAPROSYN) 500 MG TABLET    Take 1 tablet (500 mg total) by mouth 2 (two) times  daily.   PENICILLIN V POTASSIUM (VEETID) 500 MG TABLET    Take 1 tablet (500 mg total) by mouth 2 (two) times daily.   I personally performed the services described in this documentation, which was scribed in my presence. The recorded information has been reviewed and is accurate.     Barrett Henle, PA-C 09/27/16 1303    Benjiman Core, MD 09/27/16 [phone]

## 2016-09-27 NOTE — Discharge Instructions (Signed)
Take medications as prescribed. You may also take 600 mg ibuprofen 4 times daily as needed for pain relief. I recommend eating prior to taking ibuprofen to prevent gastrointestinal side effects. You may apply ice to affected area for 15-20 minutes 3-4 times daily to help with pain. °Follow-up with one of the dental clinics listed below for further management of your dental pain. °Return to the emergency department if symptoms worsen or new onset of fever, headache, neck stiffness, facial/neck swelling, unable to open jaw, unable to swallow resulting in drooling, difficulty breathing, drainage, unable to tolerate fluids.  ° °[employer] °[employer] °[facility] °[address] °[place], [place] [address] °Phone [phone] ° °The [employer] [facility] in [place], [place], exemplifies the Dental School?s vision to improve the health and quality of life of all [place] by creating leaders with a passion to care for the underserved and by leading the nation in community-based, service learning oral health education. ° °We are committed to offering comprehensive general dental services for adults, children and special needs patients in a safe, caring and professional setting. ° ° °Appointments: Our clinic is open Monday through Friday 8:00 a.m. until 5:00 p.m. The amount of time scheduled for an appointment depends on the patient?s specific needs. We ask that you keep your appointed time for care or provide 24-hour notice of all appointment changes. Parents or legal guardians must accompany minor children. °  °Payment for Services: Medicaid and other insurance plans are welcome. Payment for services is due when services are rendered and may be made by cash or credit card. If you have dental insurance, we will assist you with your claim submission. °   °Emergencies:   Emergency services will be provided Monday through Friday on a walk-in basis.  Please arrive early for emergency services. After hours emergency services will be provided for patients of record as required. °  °Services:  °Comprehensive General Dentistry °Children?s Dentistry °Oral Surgery - Extractions °Root Canals °Sealants and Tooth Colored Fillings °Crowns and Bridges °Dentures and Partial Dentures °Implant Services °Periodontal Services and Cleanings °Cosmetic Tooth Whitening °Digital Radiography °3-D/[facility]  °

## 2016-10-07 ENCOUNTER — Encounter (HOSPITAL_COMMUNITY): Payer: Self-pay | Admitting: Emergency Medicine

## 2016-10-07 ENCOUNTER — Emergency Department (HOSPITAL_COMMUNITY)
Admission: EM | Admit: 2016-10-07 | Discharge: 2016-10-07 | Disposition: A | Discharge status: Home / Self Care | Payer: Self-pay | Attending: Emergency Medicine | Admitting: Emergency Medicine

## 2016-10-07 DIAGNOSIS — L0291 Cutaneous abscess, unspecified: Secondary | ICD-10-CM | POA: Insufficient documentation

## 2016-10-07 DIAGNOSIS — Z79899 Other long term (current) drug therapy: Secondary | ICD-10-CM | POA: Insufficient documentation

## 2016-10-07 DIAGNOSIS — B20 Human immunodeficiency virus [HIV] disease: Secondary | ICD-10-CM | POA: Insufficient documentation

## 2016-10-07 MED ORDER — MORPHINE SULFATE (PF) 4 MG/ML IV SOLN
4.0000 mg | Freq: Once | INTRAVENOUS | Status: AC
Start: 2016-10-07 — End: 2016-10-07
  Administered 2016-10-07: 4 mg via INTRAMUSCULAR
  Filled 2016-10-07: qty 1

## 2016-10-07 MED ORDER — LIDOCAINE-EPINEPHRINE-TETRACAINE (LET) SOLUTION
3.0000 mL | Freq: Once | NASAL | Status: AC
  Administered 2016-10-07: 3 mL via TOPICAL
  Filled 2016-10-07: qty 3

## 2016-10-07 MED ORDER — LIDOCAINE HCL (PF) 1 % IJ SOLN
5.0000 mL | Freq: Once | INTRAMUSCULAR | Status: AC
  Administered 2016-10-07: 5 mL via INTRADERMAL
  Filled 2016-10-07: qty 30

## 2016-10-07 NOTE — ED Notes (Signed)
ED Provider at bedside. 

## 2016-10-07 NOTE — ED Provider Notes (Signed)
[facility]-EMERGENCY DEPT Provider Note   CSN: [id number] Arrival date & time: 10/07/16  1320  By signing my name below, I, Jeremy Gallagher, attest that this documentation has been prepared under the direction and in the presence of [employer] Steel Corporation[employer]ole Ryane Konieczny, PA-C. Electronically Signed: Thelma BargeNick Gallagher, Scribe. 10/07/16. 2:20 PM  History   Chief Complaint Chief Complaint  Patient presents with  . Recurrent Skin Infections   The history is provided by the patient. No language interpreter was used.    HPI Comments: Jeremy RossettiJustin D Gallagher is a 26 y.o. male with PMHx of HIV (viral load undetectable, Last CD4 count over 500) who presents to the Emergency Department complaining of constant, gradually worsening pain in his lower back/upper buttocks area for 2 days. He states he has had a cyst in the area for over 1 year that has worsened in the last few days. He has had an I&D on this cyst before. He has tried to make an appointment with his surgeon for this cyst removal. He denies fever, nausea, and vomiting. He sees Dr. [name] as his PCP and also regularly sees his infectious disease doctor. He takes medications daily for his HIV.  Past Medical History:  Diagnosis Date  . Allergy   . Anxiety   . Depression   . HIV infection [name](HCC)     Patient Active Problem List   Diagnosis Date Noted  . HIV disease (HCC) 07/22/2015  . Dyslipidemia 07/22/2015  . Elevated liver enzymes 07/22/2015  . Generalized anxiety disorder 07/22/2015  . Polysubstance abuse 07/22/2015  . Attention deficit hyperactivity disorder (ADHD) 07/14/2015  . Perirectal cyst 07/14/2015  . ALLERGIC RHINITIS 11/22/2007    Past Surgical History:  Procedure Laterality Date  . WISDOM TOOTH EXTRACTION         Home Medications    Prior to Admission medications   Medication Sig Start Date End Date Taking? Authorizing Provider  amphetamine-dextroamphetamine (ADDERALL) 30 MG tablet Take 30 mg by mouth daily.    [provider]  clonazePAM  (KLONOPIN) 1 MG tablet Take 1 mg by mouth daily as needed for anxiety.  08/21/14   [provider]  ibuprofen (ADVIL,MOTRIN) 200 MG tablet Take 800 mg by mouth every 6 (six) hours as needed for headache, mild pain or moderate pain. Reported on 07/14/2015    [provider]  naproxen (NAPROSYN) 500 MG tablet Take 1 tablet (500 mg total) by mouth 2 (two) times daily. 09/27/16   Barrett HenleNadeau, [name], PA-C  penicillin v potassium (VEETID) 500 MG tablet Take 1 tablet (500 mg total) by mouth 2 (two) times daily. 09/27/16   Barrett HenleNadeau, [name], PA-C  TRIUMEQ 600-50-300 MG tablet TAKE 1 TABLET BY MOUTH DAILY 07/19/16   Cliffton Astersampbell, [name], MD    Family History Family History  Problem Relation Age of Onset  . Hypertension Mother   . Cancer Maternal Aunt   . Diabetes Maternal Aunt   . Hypertension Maternal Aunt     Social History Social History  Substance Use Topics  . Smoking status: Never Smoker  . Smokeless tobacco: Never Used  . Alcohol use 2.4 oz/week    4 Shots of liquor per week     Comment: drinking increased since HIV diagnosis      Allergies   Patient has no known allergies.   Review of Systems Review of Systems A complete 10 system review of systems was obtained and all systems are negative except as noted in the HPI and PMH.    Physical  Exam Updated Vital Signs BP 132/80 (BP Location: Right Arm)   Pulse 98   Temp 98.4 F (36.9 C) (Oral)   Resp 18   SpO2 98%   Physical Exam  Constitutional: He is oriented to person, place, and time. He appears well-developed and well-nourished. No distress.  HENT:  Head: Normocephalic and atraumatic.  Mouth/Throat: Oropharynx is clear and moist.  Eyes: Conjunctivae and EOM are normal. Pupils are equal, round, and reactive to light.  Neck: Normal range of motion.  Cardiovascular: Normal rate, regular rhythm and intact distal pulses.   Pulmonary/Chest: Effort normal and breath sounds normal.  Abdominal: Soft. There  is no tenderness.  Musculoskeletal: Normal range of motion.  Neurological: He is alert and oriented to person, place, and time.  Skin: Skin is warm and dry. He is not diaphoretic.     Psychiatric: He has a normal mood and affect.  Nursing note and vitals reviewed.    ED Treatments / Results  DIAGNOSTIC STUDIES: Oxygen Saturation is 100% on RA, normal by my interpretation.    COORDINATION OF CARE: 2:19 PM Discussed treatment plan with pt at bedside and pt agreed to plan.  Labs (all labs ordered are listed, but only abnormal results are displayed) Labs Reviewed - No data to display  EKG  EKG Interpretation None       Radiology No results found.  Procedures Procedures (including critical care time)  Medications Ordered in ED Medications  morphine 4 MG/ML injection 4 mg (4 mg Intramuscular Given 10/07/16 1533)  lidocaine-EPINEPHrine-tetracaine (LET) solution (3 mLs Topical Given 10/07/16 1441)  lidocaine (PF) (XYLOCAINE) 1 % injection 5 mL (5 mLs Intradermal Given 10/07/16 1444)     Initial Impression / Assessment and Plan / ED Course  I have reviewed the triage vital signs and the nursing notes.  Pertinent labs & imaging results that were available during my care of the patient were reviewed by me and considered in my medical decision making (see chart for details).     Vitals:   10/07/16 1341 10/07/16 1611  BP: 136/84 132/80  Pulse: (!) 110 98  Resp: 19 18  Temp: 98.4 F (36.9 C)   TempSrc: Oral   SpO2: 100% 98%    Medications  morphine 4 MG/ML injection 4 mg (4 mg Intramuscular Given 10/07/16 1533)  lidocaine-EPINEPHrine-tetracaine (LET) solution (3 mLs Topical Given 10/07/16 1441)  lidocaine (PF) (XYLOCAINE) 1 % injection 5 mL (5 mLs Intradermal Given 10/07/16 1444)    Jeremy Gallagher is 26 y.o. male presenting with Gluteal abscess, initially tachycardic however this is resolved on my exam. Patient afebrile and well-appearing. I and D performed by PA  Maczis. Wound care and return precautions discussed.  Evaluation does not show pathology that would require ongoing emergent intervention or inpatient treatment. Pt is hemodynamically stable and mentating appropriately. Discussed findings and plan with patient/guardian, who agrees with care plan. All questions answered. Return precautions discussed and outpatient follow up given.     Final Clinical Impressions(s) / ED Diagnoses   Final diagnoses:  Abscess    New Prescriptions Discharge Medication List as of 10/07/2016  3:55 PM     I personally performed the services described in this documentation, which was scribed in my presence. The recorded information has been reviewed and is accurate.     Kaylyn Lim 10/20/16 1191    Arby Barrette, MD 10/22/16 1650

## 2016-10-07 NOTE — ED Triage Notes (Signed)
Patient c/o recurrent boil that is where back and buttock meets. Patient states that this time hasnt been able to get to head where can get to drain.  Patient reports this episode going on for 3 days.

## 2016-10-07 NOTE — ED Provider Notes (Signed)
Patient seen in conjunction with Wynetta EmeryNicole Pisciotta, PA-C. Patient with small abscess to left upper buttock requiring I&D. No surrounding erythema or evidence of cellulitis.   INCISION AND DRAINAGE Performed by: Jacinto HalimMichael M Maczis Consent: Verbal consent obtained. Risks and benefits: risks, benefits and alternatives were discussed Type: abscess  Body area: left upper buttock  Anesthesia: local infiltration  Incision was made with a scalpel.  Local anesthetic: lidocaine 1% without epinephrine  Anesthetic total: 8 ml  Complexity: complex Blunt dissection to break up loculations  Drainage: purulent  Drainage amount: moderate  Patient tolerance: Patient tolerated the procedure well with no immediate complications.     Jacinto HalimMaczis, Michael M, PA-C 10/07/16 1607    Arby BarrettePfeiffer, Marcy, MD 10/22/16 [phone]

## 2016-10-07 NOTE — Discharge Instructions (Signed)
Please follow with your primary care doctor in the next 2 days for a check-up. They must obtain records for further management.  ° °Do not hesitate to return to the Emergency Department for any new, worsening or concerning symptoms.  ° °

## 2016-10-17 ENCOUNTER — Ambulatory Visit: Payer: Self-pay

## 2016-11-01 ENCOUNTER — Ambulatory Visit: Payer: Self-pay

## 2016-11-08 ENCOUNTER — Encounter: Payer: Self-pay | Admitting: Internal Medicine

## 2016-11-23 ENCOUNTER — Ambulatory Visit (INDEPENDENT_AMBULATORY_CARE_PROVIDER_SITE_OTHER): Payer: Self-pay | Admitting: Internal Medicine

## 2016-11-23 DIAGNOSIS — B20 Human immunodeficiency virus [HIV] disease: Secondary | ICD-10-CM

## 2016-11-24 ENCOUNTER — Encounter: Payer: Self-pay | Admitting: Internal Medicine

## 2016-11-24 LAB — T-HELPER CELL (CD4) - (RCID CLINIC ONLY)
CD4 % Helper T Cell: 17 % — ABNORMAL LOW (ref 33–55)
CD4 T Cell Abs: 570 /uL (ref 400–2700)

## 2016-11-24 NOTE — Addendum Note (Signed)
Addended by: Mariea ClontsGREEN, Jessamine Barcia D on: 11/24/2016 09:00 AM   Modules accepted: Orders

## 2016-11-24 NOTE — Assessment & Plan Note (Signed)
He is doing very well with excellent adherence. His infection has been under good control. He will continue Triumeq and get blood work today. Jeremy Gallagher will undergo rapid testing and I will refer him to our PrEP clinic.

## 2016-11-24 NOTE — Progress Notes (Signed)
Patient Active Problem List   Diagnosis Date Noted  . HIV disease (HCC) 07/22/2015    Priority: High  . Dyslipidemia 07/22/2015  . Elevated liver enzymes 07/22/2015  . Generalized anxiety disorder 07/22/2015  . Polysubstance abuse 07/22/2015  . Attention deficit hyperactivity disorder (ADHD) 07/14/2015  . Perirectal cyst 07/14/2015  . ALLERGIC RHINITIS 11/22/2007    Patient's Medications  New Prescriptions   No medications on file  Previous Medications   AMPHETAMINE-DEXTROAMPHETAMINE (ADDERALL) 30 MG TABLET    Take 30 mg by mouth daily.   CLONAZEPAM (KLONOPIN) 1 MG TABLET    Take 1 mg by mouth daily as needed for anxiety.    IBUPROFEN (ADVIL,MOTRIN) 200 MG TABLET    Take 800 mg by mouth every 6 (six) hours as needed for headache, mild pain or moderate pain. Reported on 07/14/2015   NAPROXEN (NAPROSYN) 500 MG TABLET    Take 1 tablet (500 mg total) by mouth 2 (two) times daily.   PENICILLIN V POTASSIUM (VEETID) 500 MG TABLET    Take 1 tablet (500 mg total) by mouth 2 (two) times daily.   TRIUMEQ 600-50-300 MG TABLET    TAKE 1 TABLET BY MOUTH DAILY  Modified Medications   No medications on file  Discontinued Medications   No medications on file    Subjective:  Tauno is in for his routine HIV follow-up visit. He denies missing any doses of his Triumeq since his last visit. He is accompanied by his boyfriend, Cherylynn Ridges. They have been sexually active but reports using condoms consistently. Mitchel Honour is unaware of PrEP. Winson denies feeling anxious or depressed recently. He says he has not been drinking alcohol or smoking marijuana. 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   Review of Systems: Review of Systems  Constitutional: Negative for chills, diaphoresis, fever, malaise/fatigue and weight loss.  HENT: Negative for sore throat.   Respiratory: Negative for cough, sputum production and shortness of breath.   Cardiovascular: Negative for chest pain.    Gastrointestinal: Negative for abdominal pain, diarrhea, heartburn, nausea and vomiting.  Genitourinary: Negative for dysuria and frequency.  Musculoskeletal: Negative for joint pain and myalgias.  Skin: Negative for rash.  Neurological: Negative for dizziness and headaches.  Psychiatric/Behavioral: Negative for depression and substance abuse. The patient is not nervous/anxious.     Past Medical History:  Diagnosis Date  . Allergy   . Anxiety   . Depression   . HIV infection [facility])     Social History  Substance Use Topics  . Smoking status: Never Smoker  . Smokeless tobacco: Never Used  . Alcohol use 2.4 oz/week    4 Shots of liquor per week     Comment: drinking increased since HIV diagnosis     Family History  Problem Relation Age of Onset  . Hypertension Mother   . Cancer Maternal Aunt   . Diabetes Maternal Aunt   . Hypertension Maternal Aunt     No Known Allergies  Objective:  There were no vitals filed for this visit. There is no height or weight on file to calculate BMI.  Physical Exam  Constitutional: He is oriented to person, place, and time.  He is in good spirits.  HENT:  Mouth/Throat: No oropharyngeal exudate.  Eyes: Conjunctivae are normal.  Cardiovascular: Normal rate and regular rhythm.   No murmur heard. Pulmonary/Chest: Effort normal and breath sounds normal.  Abdominal: Soft. He exhibits no mass. There is no tenderness.  Musculoskeletal:  Normal range of motion.  Neurological: He is alert and oriented to person, place, and time.  Skin: No rash noted.  Psychiatric: Mood and affect normal.    Lab Results Lab Results  Component Value Date   WBC 7.2 07/05/2016   HGB 13.1 (L) 07/05/2016   HCT 40.0 07/05/2016   MCV 88.9 07/05/2016   PLT 293 07/05/2016    Lab Results  Component Value Date   CREATININE 1.16 07/05/2016   BUN 12 07/05/2016   NA 143 07/05/2016   K 3.6 07/05/2016   CL 106 07/05/2016   CO2 26 07/05/2016    Lab Results   Component Value Date   ALT 13 07/05/2016   AST 16 07/05/2016   ALKPHOS 57 07/05/2016   BILITOT 0.6 07/05/2016    Lab Results  Component Value Date   CHOL 143 04/19/2016   HDL 43 04/19/2016   LDLCALC 82 04/19/2016   TRIG 92 04/19/2016   CHOLHDL 3.3 04/19/2016   Lab Results  Component Value Date   LABRPR NON REAC 04/19/2016   HIV 1 RNA Quant (copies/mL)  Date Value  07/05/2016 <20 NOT DETECTED  04/19/2016 <20  11/30/2015 47 (H)   CD4 T Cell Abs (/uL)  Date Value  07/05/2016 550  04/19/2016 560  11/30/2015 410     Problem List Items Addressed This Visit      High   HIV disease (HCC)    He is doing very well with excellent adherence. His infection has been under good control. He will continue Triumeq and get blood work today. Mitchel Honourravon will undergo rapid testing and I will refer him to our PrEP clinic.           Cliffton AstersJohn Jasiah Elsen, MD [facility] [phone] pager   [phone] cell 11/24/2016, 8:01 AM

## 2016-11-28 LAB — HIV-1 RNA QUANT-NO REFLEX-BLD
HIV 1 RNA Quant: <20 copies/mL — AB
HIV-1 RNA QUANT, LOG: DETECTED {Log_copies}/mL — AB

## 2017-01-20 ENCOUNTER — Other Ambulatory Visit: Payer: Self-pay | Admitting: Internal Medicine

## 2017-01-20 DIAGNOSIS — B20 Human immunodeficiency virus [HIV] disease: Secondary | ICD-10-CM

## 2017-01-23 ENCOUNTER — Ambulatory Visit: Payer: Self-pay | Admitting: Internal Medicine

## 2017-03-16 ENCOUNTER — Ambulatory Visit: Payer: Self-pay | Admitting: Internal Medicine

## 2017-07-28 ENCOUNTER — Other Ambulatory Visit: Payer: Self-pay | Admitting: Internal Medicine

## 2017-07-28 DIAGNOSIS — B20 Human immunodeficiency virus [HIV] disease: Secondary | ICD-10-CM

## 2017-08-05 ENCOUNTER — Encounter (HOSPITAL_COMMUNITY): Payer: Self-pay

## 2017-08-05 ENCOUNTER — Emergency Department (HOSPITAL_COMMUNITY): Payer: Self-pay

## 2017-08-05 ENCOUNTER — Emergency Department (HOSPITAL_COMMUNITY)
Admission: EM | Admit: 2017-08-05 | Discharge: 2017-08-05 | Disposition: A | Discharge status: Home / Self Care | Payer: Self-pay | Attending: Physician Assistant | Admitting: Physician Assistant

## 2017-08-05 ENCOUNTER — Other Ambulatory Visit: Payer: Self-pay

## 2017-08-05 DIAGNOSIS — Z79899 Other long term (current) drug therapy: Secondary | ICD-10-CM | POA: Insufficient documentation

## 2017-08-05 DIAGNOSIS — B20 Human immunodeficiency virus [HIV] disease: Secondary | ICD-10-CM | POA: Insufficient documentation

## 2017-08-05 DIAGNOSIS — R55 Syncope and collapse: Secondary | ICD-10-CM | POA: Insufficient documentation

## 2017-08-05 LAB — BASIC METABOLIC PANEL
Anion gap: 10 (ref 5–15)
BUN: 9 mg/dL (ref 6–20)
CALCIUM: 9.7 mg/dL (ref 8.9–10.3)
CHLORIDE: 106 mmol/L (ref 101–111)
CO2: 25 mmol/L (ref 22–32)
CREATININE: 0.88 mg/dL (ref 0.61–1.24)
GFR calc Af Amer: >60 mL/min (ref >60)
GFR calc non Af Amer: >60 mL/min (ref >60)
Glucose, Bld: 111 mg/dL — ABNORMAL HIGH (ref 65–99)
Potassium: 4.2 mmol/L (ref 3.5–5.1)
SODIUM: 141 mmol/L (ref 135–145)

## 2017-08-05 LAB — URINALYSIS, ROUTINE W REFLEX MICROSCOPIC
Bacteria, UA: NONE SEEN
Bilirubin Urine: NEGATIVE
Glucose, UA: NEGATIVE mg/dL
Hgb urine dipstick: NEGATIVE
Ketones, ur: NEGATIVE mg/dL
LEUKOCYTES UA: NEGATIVE
Nitrite: NEGATIVE
Protein, ur: 100 mg/dL — AB
SPECIFIC GRAVITY, URINE: 1.024 (ref 1.005–1.030)
pH: 5 (ref 5.0–8.0)

## 2017-08-05 LAB — RAPID URINE DRUG SCREEN, HOSP PERFORMED
Amphetamines: NOT DETECTED
BENZODIAZEPINES: NOT DETECTED
Barbiturates: NOT DETECTED
COCAINE: POSITIVE — AB
Opiates: POSITIVE — AB
Tetrahydrocannabinol: POSITIVE — AB

## 2017-08-05 LAB — CBG MONITORING, ED: Glucose-Capillary: 101 mg/dL — ABNORMAL HIGH (ref 65–99)

## 2017-08-05 LAB — CBC
HCT: 42.2 % (ref 39.0–52.0)
Hemoglobin: 13.7 g/dL (ref 13.0–17.0)
MCH: 29 pg (ref 26.0–34.0)
MCHC: 32.5 g/dL (ref 30.0–36.0)
MCV: 89.2 fL (ref 78.0–100.0)
Platelets: 276 10*3/uL (ref 150–400)
RBC: 4.73 MIL/uL (ref 4.22–5.81)
RDW: 14.2 % (ref 11.5–15.5)
WBC: 9.7 10*3/uL (ref 4.0–10.5)

## 2017-08-05 LAB — I-STAT TROPONIN, ED: TROPONIN I, POC: 0 ng/mL (ref 0.00–0.08)

## 2017-08-05 MED ORDER — SODIUM CHLORIDE 0.9 % IV BOLUS
1000.0000 mL | Freq: Once | INTRAVENOUS | Status: AC
  Administered 2017-08-05: 1000 mL via INTRAVENOUS

## 2017-08-05 NOTE — ED Provider Notes (Signed)
[facility] EMERGENCY DEPARTMENT Provider Note   CSN: [id number] Arrival date & time: 08/05/17  1437     History   Chief Complaint Chief Complaint  Patient presents with  . Dizziness    HPI Jeremy Gallagher is a 27 y.o. male.  HPI   Patient is a 27 year old male past medical history significant for HIV, currently on anti-retroviral's, compliant.  Patient has history of polysubstance abuse, ADHD and anxiety.  Patient was talking with his mom and all of a sudden he felt dizzy.  Apparently he slumped over.  There was some report that family members have started CPR, however patient remembers the entire thing.  It EMS did not believe that patient never lost his pulse.  Patient was alert oriented and talking when they arrived.  Patient denies any recent symptoms.  Denies any recent drug use, alcohol use, nausea, vomiting, headaches, chest pain, shortness of breath.  Past Medical History:  Diagnosis Date  . Allergy   . Anxiety   . Depression   . HIV infection [facility])     Patient Active Problem List   Diagnosis Date Noted  . HIV disease (HCC) 07/22/2015  . Dyslipidemia 07/22/2015  . Elevated liver enzymes 07/22/2015  . Generalized anxiety disorder 07/22/2015  . Polysubstance abuse (HCC) 07/22/2015  . Attention deficit hyperactivity disorder (ADHD) 07/14/2015  . Perirectal cyst 07/14/2015  . ALLERGIC RHINITIS 11/22/2007    Past Surgical History:  Procedure Laterality Date  . WISDOM TOOTH EXTRACTION          Home Medications    Prior to Admission medications   Medication Sig Start Date End Date Taking? Authorizing Provider  amphetamine-dextroamphetamine (ADDERALL) 30 MG tablet Take 30 mg by mouth daily.   Yes [provider]  clonazePAM (KLONOPIN) 1 MG tablet Take 1 mg by mouth daily.  08/21/14  Yes [provider]  ibuprofen (ADVIL,MOTRIN) 200 MG tablet Take 800 mg by mouth every 6 (six) hours as needed for headache, mild pain or  moderate pain. Reported on 07/14/2015   Yes [provider]  TRIUMEQ 600-50-300 MG tablet TAKE 1 TABLET BY MOUTH DAILY 07/28/17  Yes Cliffton Asters, MD  naproxen (NAPROSYN) 500 MG tablet Take 1 tablet (500 mg total) by mouth 2 (two) times daily. Patient not taking: Reported on 08/05/2017 09/27/16   Barrett Henle, PA-C    Family History Family History  Problem Relation Age of Onset  . Hypertension Mother   . Cancer Maternal Aunt   . Diabetes Maternal Aunt   . Hypertension Maternal Aunt     Social History Social History   Tobacco Use  . Smoking status: Never Smoker  . Smokeless tobacco: Never Used  Substance Use Topics  . Alcohol use: Yes    Alcohol/week: 2.4 oz    Types: 4 Shots of liquor per week    Comment: drinking increased since HIV diagnosis   . Drug use: No     Allergies   Patient has no known allergies.   Review of Systems Review of Systems  Constitutional: Negative for activity change, fatigue and fever.  HENT: Negative for congestion.   Respiratory: Negative for cough and shortness of breath.   Cardiovascular: Negative for chest pain.  Gastrointestinal: Negative for abdominal pain.  All other systems reviewed and are negative.    Physical Exam Updated Vital Signs BP 104/77   Pulse 76   Temp 98.4 F (36.9 C) (Oral)   Resp 11   Ht 5\' 4"  (  1.626 m)   Wt 70.8 kg (156 lb)   SpO2 97%   BMI 26.78 kg/m   Physical Exam  Constitutional: He is oriented to person, place, and time. He appears well-nourished.  Mild pallor  HENT:  Head: Normocephalic.  Eyes: Conjunctivae are normal. Right eye exhibits no discharge. Left eye exhibits no discharge.  Neck: Normal range of motion.  Cardiovascular: Normal rate and regular rhythm.  Pulmonary/Chest: Effort normal and breath sounds normal. No respiratory distress.  Abdominal: Soft. He exhibits no distension. There is no tenderness.  Musculoskeletal: Normal range of motion. He exhibits no edema.    Neurological: He is oriented to person, place, and time. No cranial nerve deficit. Coordination normal.  Skin: Skin is warm and dry. He is not diaphoretic.  Psychiatric: He has a normal mood and affect. His behavior is normal.  Nursing note and vitals reviewed.    ED Treatments / Results  Labs (all labs ordered are listed, but only abnormal results are displayed) Labs Reviewed  BASIC METABOLIC PANEL - Abnormal; Notable for the following components:      Result Value   Glucose, Bld 111 (*)    All other components within normal limits  URINALYSIS, ROUTINE W REFLEX MICROSCOPIC - Abnormal; Notable for the following components:   APPearance HAZY (*)    Protein, ur 100 (*)    Squamous Epithelial / LPF 0-5 (*)    All other components within normal limits  RAPID URINE DRUG SCREEN, HOSP PERFORMED - Abnormal; Notable for the following components:   Opiates POSITIVE (*)    Cocaine POSITIVE (*)    Tetrahydrocannabinol POSITIVE (*)    All other components within normal limits  CBG MONITORING, ED - Abnormal; Notable for the following components:   Glucose-Capillary 101 (*)    All other components within normal limits  CBC  CBG MONITORING, ED  I-STAT TROPONIN, ED    EKG EKG Interpretation  Date/Time:  Saturday August 05 2017 14:42:47 EDT Ventricular Rate:  92 PR Interval:    QRS Duration: 98 QT Interval:  361 QTC Calculation: 447 R Axis:   92 Text Interpretation:  No significant change since last tracing Confirmed by Bary CastillaMackuen, Talal Fritchman ([id number]) on 08/05/2017 3:06:44 PM   Radiology Dg Chest 2 View  Result Date: 08/05/2017 CLINICAL DATA:  Chest pain EXAM: CHEST - 2 VIEW COMPARISON:  01/21/2016 FINDINGS: Heart and mediastinal contours are within normal limits. No focal opacities or effusions. No acute bony abnormality. IMPRESSION: No active cardiopulmonary disease. Electronically Signed   By: Charlett NoseKevin  Dover M.D.   On: 08/05/2017 16:37   Ct Head Wo Contrast  Result Date:  08/05/2017 CLINICAL DATA:  Altered level of consciousness EXAM: CT HEAD WITHOUT CONTRAST TECHNIQUE: Contiguous axial images were obtained from the base of the skull through the vertex without intravenous contrast. COMPARISON:  None. FINDINGS: Brain: No acute intracranial abnormality. Specifically, no hemorrhage, hydrocephalus, mass lesion, acute infarction, or significant intracranial injury. Vascular: No hyperdense vessel or unexpected calcification. Skull: No acute calvarial abnormality. Sinuses/Orbits: Visualized paranasal sinuses and mastoids clear. Orbital soft tissues unremarkable. Other: None IMPRESSION: Normal study. Electronically Signed   By: Charlett NoseKevin  Dover M.D.   On: 08/05/2017 16:25    Procedures Procedures (including critical care time)  Medications Ordered in ED Medications  sodium chloride 0.9 % bolus 1,000 mL (0 mLs Intravenous Stopped 08/05/17 1758)     Initial Impression / Assessment and Plan / ED Course  I have reviewed the triage vital signs and the nursing notes.  Pertinent labs & imaging results that were available during my care of the patient were reviewed by me and considered in my medical decision making (see chart for details).      Patient is a 27 year old male past medical history significant for HIV, currently on anti-retroviral's, compliant.  Patient has history of polysubstance abuse, ADHD and anxiety.  Patient was talking with his mom and all of a sudden he felt dizzy.  Apparently he slumped over.  There was some report that family members have started CPR, however patient remembers the entire thing.  It EMS did not believe that patient never lost his pulse.  Patient was alert oriented and talking when they arrived.  Patient denies any recent symptoms.  Denies any recent drug use, alcohol use, nausea, vomiting, headaches, chest pain, shortness of breath.  3:54 PM Very atypical story.  Patient really has no symptoms preceding this and no symptoms afterwards.  I am  unsure what the dizziness is.  He does report that he feels mildly dizzy upon standing.  This could be dehydration leading to orthostatic hypotension.  9:39 PM All patient's labs x-ray and CT were reassuring.  Patient walked normally and ate.  He appears baseline.  Will discharge home with follow-up with primary care.  Final Clinical Impressions(s) / ED Diagnoses   Final diagnoses:  Near syncope    ED Discharge Orders    None       Abelino Derrick, MD 08/05/17 2140

## 2017-08-05 NOTE — ED Triage Notes (Signed)
Pt brought in by [facility] from home- initially called out for a CPR in progress- on EMS arrival pt was awake and talking. Per EMS family attempted mouth to mouth but no compressions. Per family pt positive for LOC however pt denies and states he remembers all events. Pt A+Ox4 on arrival to ED and has no current c/o.

## 2017-08-05 NOTE — ED Notes (Signed)
Pt ambulated independently to restroom with steady gait

## 2017-08-05 NOTE — ED Notes (Signed)
Patient transported to CT 

## 2017-08-05 NOTE — Discharge Instructions (Addendum)
We could not find a reason for your near syncope today.  Could be that you are dehydrated.  Please be sure to drink plenty of fluids.  We are glad you are feeling improved and follow-up with your primary care physician.  Please return if you have any concerns.

## 2017-08-22 ENCOUNTER — Ambulatory Visit: Payer: Self-pay

## 2017-08-23 ENCOUNTER — Encounter: Payer: Self-pay | Admitting: Internal Medicine

## 2017-08-31 ENCOUNTER — Other Ambulatory Visit: Payer: Self-pay | Admitting: Internal Medicine

## 2017-08-31 DIAGNOSIS — B20 Human immunodeficiency virus [HIV] disease: Secondary | ICD-10-CM

## 2017-09-01 ENCOUNTER — Other Ambulatory Visit: Payer: Self-pay

## 2017-09-01 ENCOUNTER — Other Ambulatory Visit: Payer: Self-pay | Admitting: *Deleted

## 2017-09-01 DIAGNOSIS — Z113 Encounter for screening for infections with a predominantly sexual mode of transmission: Secondary | ICD-10-CM

## 2017-09-01 DIAGNOSIS — B20 Human immunodeficiency virus [HIV] disease: Secondary | ICD-10-CM

## 2017-09-14 ENCOUNTER — Ambulatory Visit: Payer: Self-pay | Admitting: Internal Medicine

## 2017-11-24 ENCOUNTER — Other Ambulatory Visit: Payer: Self-pay | Admitting: Internal Medicine

## 2017-11-24 DIAGNOSIS — B20 Human immunodeficiency virus [HIV] disease: Secondary | ICD-10-CM

## 2017-12-01 ENCOUNTER — Other Ambulatory Visit: Payer: Self-pay | Admitting: Internal Medicine

## 2017-12-01 DIAGNOSIS — B20 Human immunodeficiency virus [HIV] disease: Secondary | ICD-10-CM

## 2017-12-25 ENCOUNTER — Other Ambulatory Visit: Payer: Self-pay

## 2018-01-08 ENCOUNTER — Encounter: Payer: Self-pay | Admitting: Internal Medicine

## 2018-02-26 ENCOUNTER — Other Ambulatory Visit: Payer: Self-pay

## 2018-02-26 ENCOUNTER — Ambulatory Visit: Payer: Self-pay

## 2018-02-26 DIAGNOSIS — B20 Human immunodeficiency virus [HIV] disease: Secondary | ICD-10-CM

## 2018-02-26 DIAGNOSIS — Z113 Encounter for screening for infections with a predominantly sexual mode of transmission: Secondary | ICD-10-CM

## 2018-02-27 ENCOUNTER — Encounter: Payer: Self-pay | Admitting: Internal Medicine

## 2018-02-27 LAB — T-HELPER CELL (CD4) - (RCID CLINIC ONLY)
CD4 % Helper T Cell: 16 % — ABNORMAL LOW (ref 33–55)
CD4 T CELL ABS: 570 /uL (ref 400–2700)

## 2018-02-28 LAB — CBC WITH DIFFERENTIAL/PLATELET
BASOS ABS: 17 {cells}/uL (ref 0–200)
BASOS PCT: 0.2 %
EOS ABS: 86 {cells}/uL (ref 15–500)
Eosinophils Relative: 1 %
HCT: 37.9 % — ABNORMAL LOW (ref 38.5–50.0)
HEMOGLOBIN: 12.4 g/dL — AB (ref 13.2–17.1)
Lymphs Abs: 3526 cells/uL (ref 850–3900)
MCH: 28 pg (ref 27.0–33.0)
MCHC: 32.7 g/dL (ref 32.0–36.0)
MCV: 85.6 fL (ref 80.0–100.0)
MONOS PCT: 6.3 %
MPV: 9.5 fL (ref 7.5–12.5)
NEUTROS ABS: 4429 {cells}/uL (ref 1500–7800)
Neutrophils Relative %: 51.5 %
Platelets: 284 10*3/uL (ref 140–400)
RBC: 4.43 10*6/uL (ref 4.20–5.80)
RDW: 14.2 % (ref 11.0–15.0)
Total Lymphocyte: 41 %
WBC: 8.6 10*3/uL (ref 3.8–10.8)
WBCMIX: 542 {cells}/uL (ref 200–950)

## 2018-02-28 LAB — COMPLETE METABOLIC PANEL WITH GFR
AG RATIO: 2 (calc) (ref 1.0–2.5)
ALT: 7 U/L — AB (ref 9–46)
AST: 17 U/L (ref 10–40)
Albumin: 5 g/dL (ref 3.6–5.1)
Alkaline phosphatase (APISO): 53 U/L (ref 40–115)
BILIRUBIN TOTAL: 0.4 mg/dL (ref 0.2–1.2)
BUN: 11 mg/dL (ref 7–25)
CO2: 28 mmol/L (ref 20–32)
Calcium: 9.8 mg/dL (ref 8.6–10.3)
Chloride: 104 mmol/L (ref 98–110)
Creat: 1.15 mg/dL (ref 0.60–1.35)
GFR, EST AFRICAN AMERICAN: 101 mL/min/{1.73_m2} (ref >=60)
GFR, Est Non African American: 87 mL/min/{1.73_m2} (ref >=60)
Globulin: 2.5 g/dL (calc) (ref 1.9–3.7)
Glucose, Bld: 77 mg/dL (ref 65–99)
Potassium: 3.6 mmol/L (ref 3.5–5.3)
Sodium: 140 mmol/L (ref 135–146)
TOTAL PROTEIN: 7.5 g/dL (ref 6.1–8.1)

## 2018-02-28 LAB — HIV-1 RNA QUANT-NO REFLEX-BLD
HIV 1 RNA Quant: 183 copies/mL — ABNORMAL HIGH
HIV-1 RNA Quant, Log: 2.26 Log copies/mL — ABNORMAL HIGH

## 2018-02-28 LAB — RPR: RPR: NONREACTIVE

## 2018-03-12 ENCOUNTER — Ambulatory Visit: Payer: Self-pay | Admitting: Internal Medicine

## 2018-08-10 ENCOUNTER — Other Ambulatory Visit: Payer: Self-pay | Admitting: Infectious Diseases

## 2018-08-10 DIAGNOSIS — B20 Human immunodeficiency virus [HIV] disease: Secondary | ICD-10-CM

## 2018-10-13 ENCOUNTER — Other Ambulatory Visit: Payer: Self-pay | Admitting: Infectious Diseases

## 2018-10-13 DIAGNOSIS — B20 Human immunodeficiency virus [HIV] disease: Secondary | ICD-10-CM

## 2018-11-28 ENCOUNTER — Other Ambulatory Visit: Payer: Self-pay

## 2018-11-28 ENCOUNTER — Ambulatory Visit: Payer: Self-pay

## 2018-12-30 IMAGING — CT CT HEAD W/O CM
3 series · 15 of 47 positions shown, 18 images · non-contrast
Comparison: None.

CLINICAL DATA: Altered level of consciousness

EXAM:
CT HEAD WITHOUT CONTRAST
TECHNIQUE: Contiguous axial images were obtained from the base of the skull
through the vertex without intravenous contrast.

[Series 2: head 5.0 h30s · axial · 0.45mm/px · z∈[-84,+46]mm · 9 of 32 slices shown, 12 images]
[im 3/32  brain]
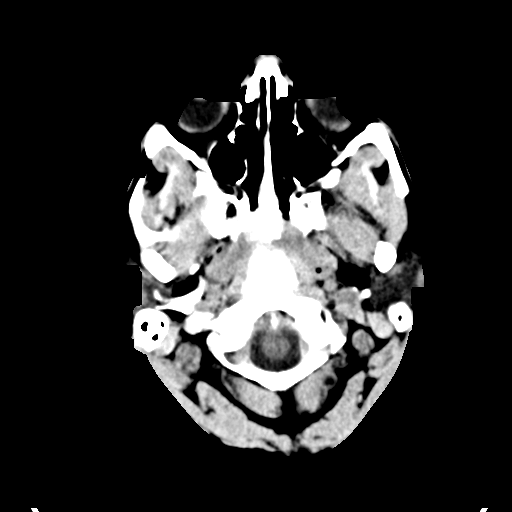
[im 3/32  bone]
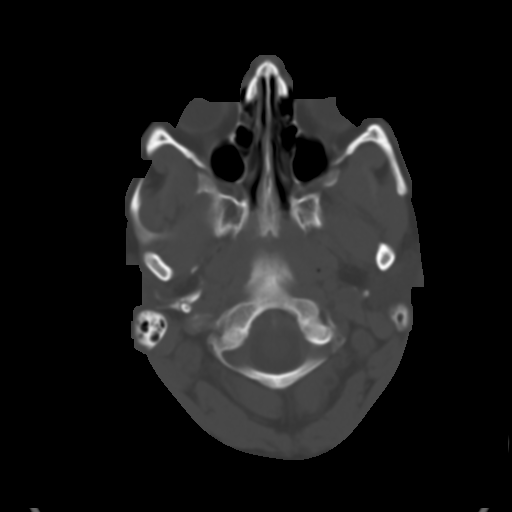
[im 6/32  brain]
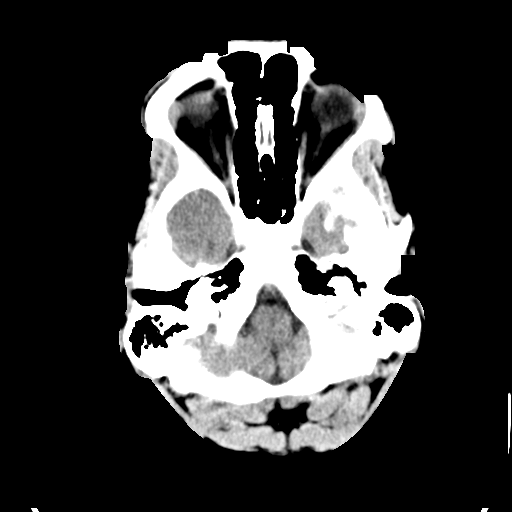
[im 9/32  brain]
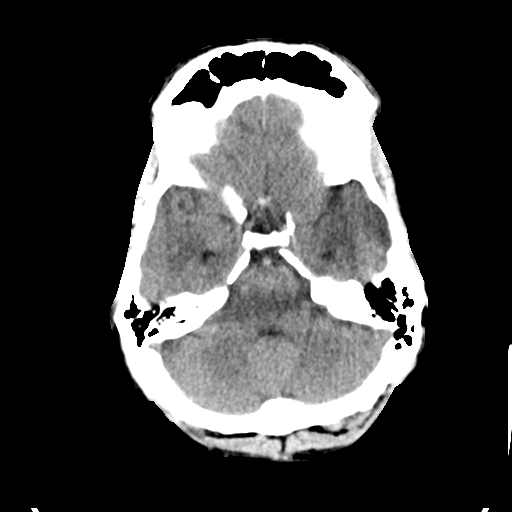
[im 12/32  brain]
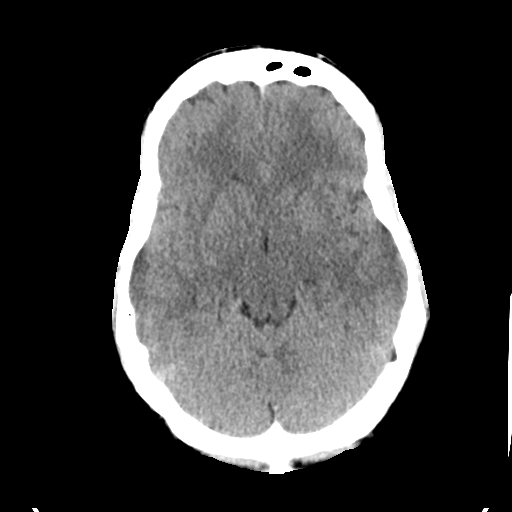
[im 17/32  brain]
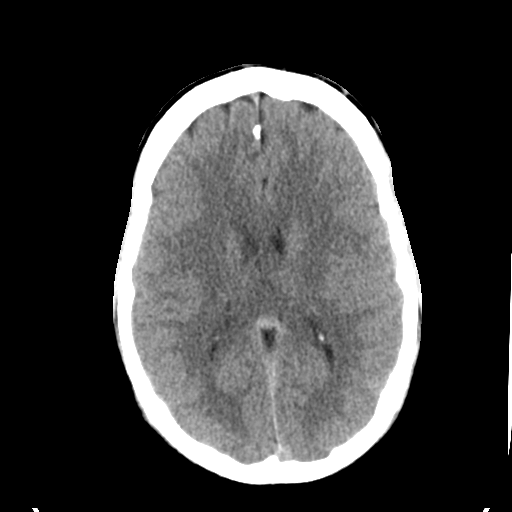
[im 17/32  bone]
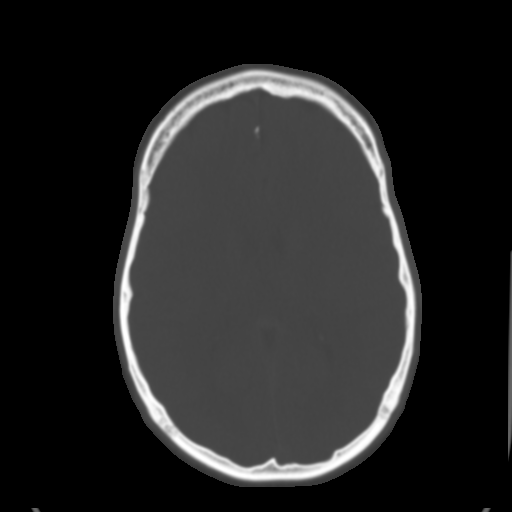
[im 20/32  brain]
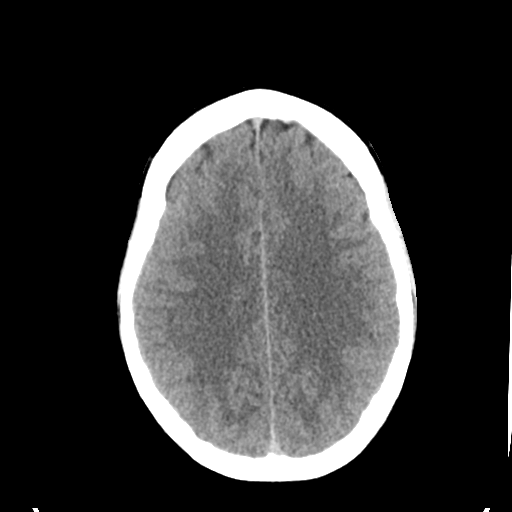
[im 23/32  brain]
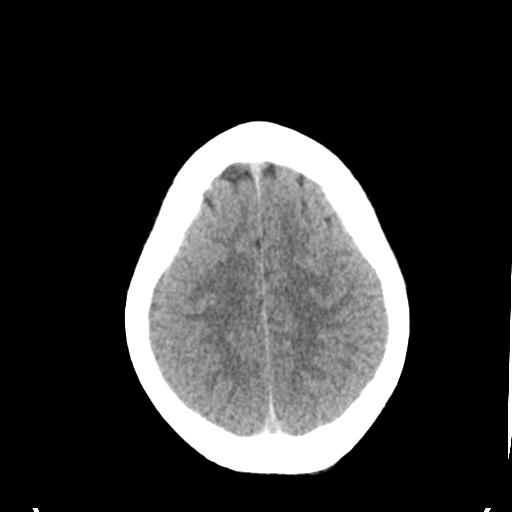
[im 26/32  brain]
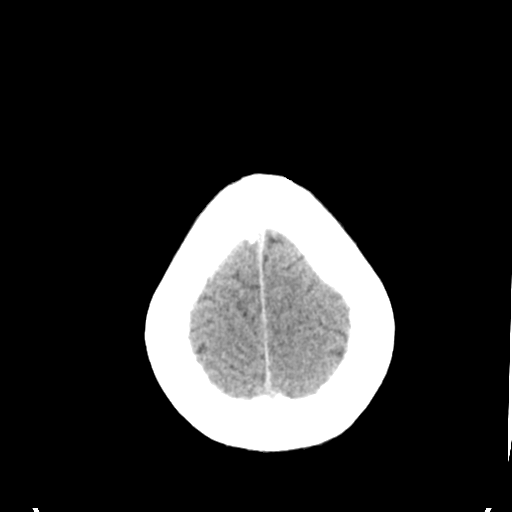
[im 29/32  brain]
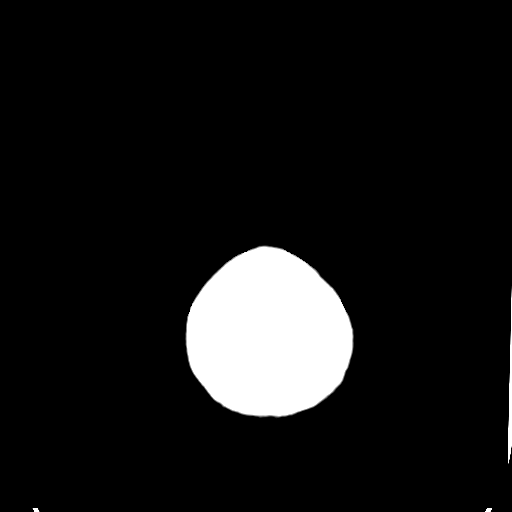
[im 29/32  bone]
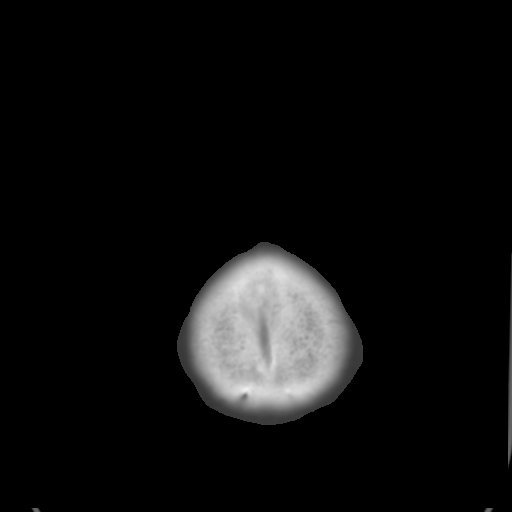

[Series 4: head 3.0 mpr cor · coronal · 0.29mm/px · 3 of 71 slices shown]
[im 24/71  brain]
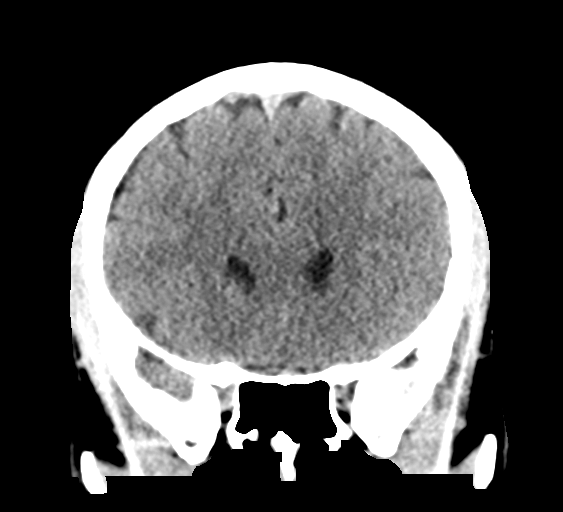
[im 32/71  brain]
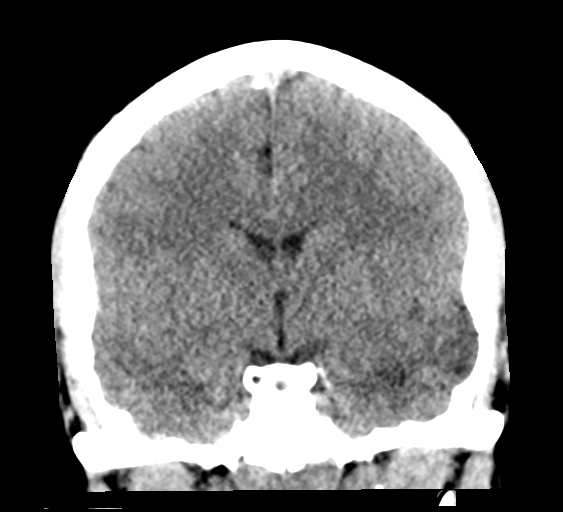
[im 39/71  brain]
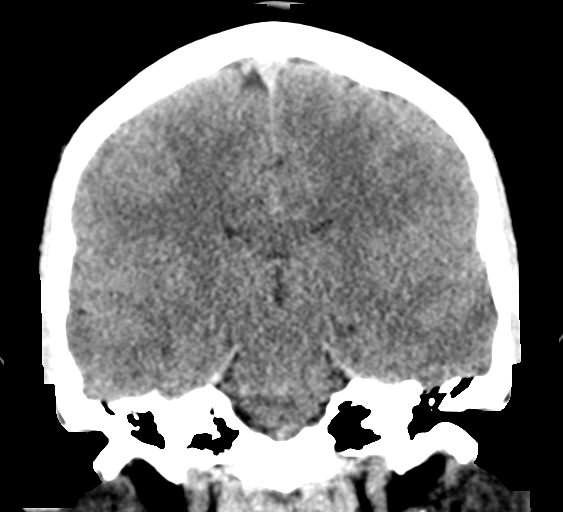

[Series 5: head 3.0 mpr sag · sagittal · 0.30mm/px · 3 of 53 slices shown]
[im 18/53  brain]
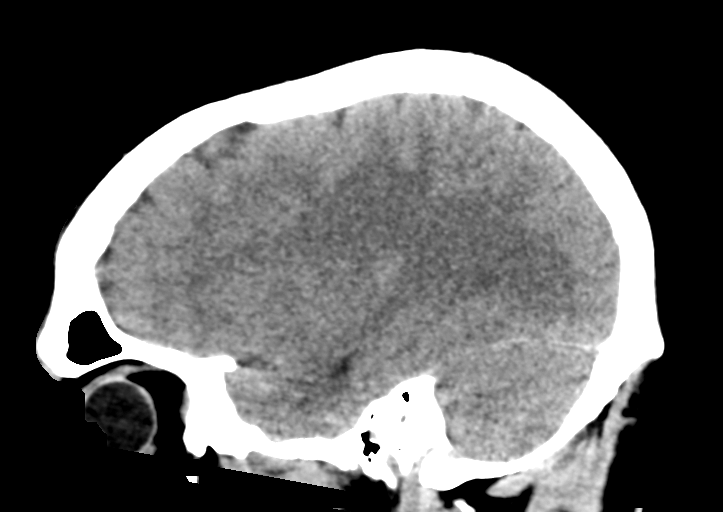
[im 27/53  brain]
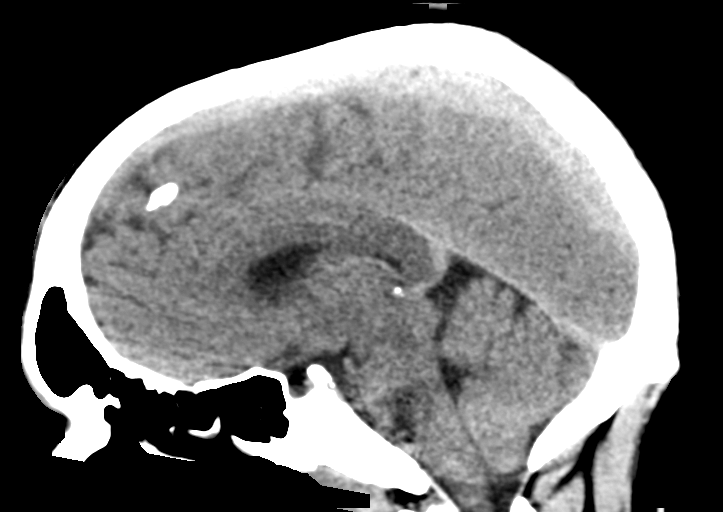
[im 35/53  brain]
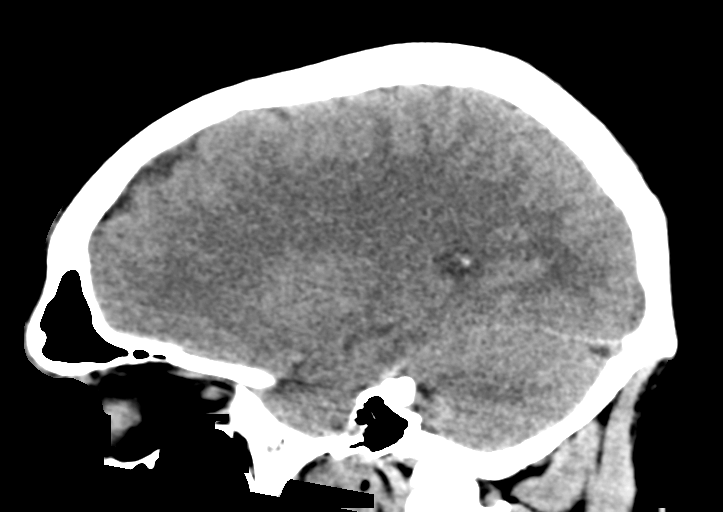

[15 of 47 positions shown; findings below may reference images not displayed]

FINDINGS: Brain: No acute intracranial abnormality. Specifically, no
hemorrhage, hydrocephalus, mass lesion, acute infarction, or
significant intracranial injury.

Vascular: No hyperdense vessel or unexpected calcification.

Skull: No acute calvarial abnormality.

Sinuses/Orbits: Visualized paranasal sinuses and mastoids clear.
Orbital soft tissues unremarkable.

Other: None
IMPRESSION: Normal study.

## 2019-01-09 ENCOUNTER — Encounter: Payer: Self-pay | Admitting: Internal Medicine

## 2019-02-08 ENCOUNTER — Other Ambulatory Visit: Payer: Self-pay | Admitting: Infectious Diseases

## 2019-02-08 DIAGNOSIS — B20 Human immunodeficiency virus [HIV] disease: Secondary | ICD-10-CM

## 2019-02-18 ENCOUNTER — Other Ambulatory Visit: Payer: Self-pay

## 2019-02-25 ENCOUNTER — Other Ambulatory Visit: Payer: Self-pay

## 2019-02-25 ENCOUNTER — Other Ambulatory Visit

## 2019-02-25 DIAGNOSIS — Z79899 Other long term (current) drug therapy: Secondary | ICD-10-CM

## 2019-02-25 DIAGNOSIS — B20 Human immunodeficiency virus [HIV] disease: Secondary | ICD-10-CM

## 2019-02-25 DIAGNOSIS — Z113 Encounter for screening for infections with a predominantly sexual mode of transmission: Secondary | ICD-10-CM

## 2019-02-26 LAB — URINE CYTOLOGY ANCILLARY ONLY
Chlamydia: NEGATIVE
Comment: NEGATIVE
Comment: NORMAL
Neisseria Gonorrhea: NEGATIVE

## 2019-02-26 LAB — T-HELPER CELL (CD4) - (RCID CLINIC ONLY)
CD4 % Helper T Cell: 12 % — ABNORMAL LOW (ref 33–65)
CD4 T Cell Abs: 464 /uL (ref 400–1790)

## 2019-02-28 LAB — HIV-1 RNA QUANT-NO REFLEX-BLD
HIV 1 RNA Quant: 11900 copies/mL — ABNORMAL HIGH
HIV-1 RNA Quant, Log: 4.08 Log copies/mL — ABNORMAL HIGH

## 2019-02-28 LAB — COMPREHENSIVE METABOLIC PANEL
AG Ratio: 1.9 (calc) (ref 1.0–2.5)
ALT: 21 U/L (ref 9–46)
AST: 17 U/L (ref 10–40)
Albumin: 4.7 g/dL (ref 3.6–5.1)
Alkaline phosphatase (APISO): 53 U/L (ref 36–130)
BUN: 9 mg/dL (ref 7–25)
CO2: 26 mmol/L (ref 20–32)
Calcium: 10.3 mg/dL (ref 8.6–10.3)
Chloride: 106 mmol/L (ref 98–110)
Creat: 1.05 mg/dL (ref 0.60–1.35)
Globulin: 2.5 g/dL (calc) (ref 1.9–3.7)
Glucose, Bld: 86 mg/dL (ref 65–99)
Potassium: 4.7 mmol/L (ref 3.5–5.3)
Sodium: 142 mmol/L (ref 135–146)
Total Bilirubin: 0.5 mg/dL (ref 0.2–1.2)
Total Protein: 7.2 g/dL (ref 6.1–8.1)

## 2019-02-28 LAB — CBC WITH DIFFERENTIAL/PLATELET
Absolute Monocytes: 525 cells/uL (ref 200–950)
Basophils Absolute: 30 cells/uL (ref 0–200)
Basophils Relative: 0.3 %
Eosinophils Absolute: 59 cells/uL (ref 15–500)
Eosinophils Relative: 0.6 %
HCT: 45.5 % (ref 38.5–50.0)
Hemoglobin: 14.7 g/dL (ref 13.2–17.1)
Lymphs Abs: 4326 cells/uL — ABNORMAL HIGH (ref 850–3900)
MCH: 26.9 pg — ABNORMAL LOW (ref 27.0–33.0)
MCHC: 32.3 g/dL (ref 32.0–36.0)
MCV: 83.3 fL (ref 80.0–100.0)
MPV: 9.6 fL (ref 7.5–12.5)
Monocytes Relative: 5.3 %
Neutro Abs: 4960 cells/uL (ref 1500–7800)
Neutrophils Relative %: 50.1 %
Platelets: 329 10*3/uL (ref 140–400)
RBC: 5.46 10*6/uL (ref 4.20–5.80)
RDW: 15.2 % — ABNORMAL HIGH (ref 11.0–15.0)
Total Lymphocyte: 43.7 %
WBC: 9.9 10*3/uL (ref 3.8–10.8)

## 2019-02-28 LAB — LIPID PANEL
Cholesterol: 146 mg/dL (ref <200)
HDL: 30 mg/dL — ABNORMAL LOW (ref >=40)
LDL Cholesterol (Calc): 95 mg/dL (calc)
Non-HDL Cholesterol (Calc): 116 mg/dL (calc) (ref <130)
Total CHOL/HDL Ratio: 4.9 (calc) (ref <5.0)
Triglycerides: 112 mg/dL (ref <150)

## 2019-02-28 LAB — RPR: RPR Ser Ql: NONREACTIVE

## 2019-03-11 ENCOUNTER — Encounter: Payer: Self-pay | Admitting: Internal Medicine

## 2019-03-11 ENCOUNTER — Ambulatory Visit (INDEPENDENT_AMBULATORY_CARE_PROVIDER_SITE_OTHER): Admitting: Internal Medicine

## 2019-03-11 ENCOUNTER — Other Ambulatory Visit: Payer: Self-pay

## 2019-03-11 DIAGNOSIS — B20 Human immunodeficiency virus [HIV] disease: Secondary | ICD-10-CM

## 2019-03-11 DIAGNOSIS — Z23 Encounter for immunization: Secondary | ICD-10-CM | POA: Diagnosis not present

## 2019-03-11 NOTE — Assessment & Plan Note (Addendum)
He was not on Triumeq when he came in for blood work recently and, not surprisingly, his viral load has reactivated.  However his CD4 count remains normal..  It is too soon to redraw blood work today.  He anticipates being released from jail in 32 days.  I will get him back shortly after his release to repeat lab work.  He received his influenza vaccine today.
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
## 2019-03-12 ENCOUNTER — Telehealth: Payer: Self-pay | Admitting: *Deleted

## 2019-03-12 NOTE — Telephone Encounter (Signed)
RN spoke with mother, assuaged her concerns.

## 2019-03-12 NOTE — Telephone Encounter (Signed)
-----   Message from [facility] sent at 03/08/2019 12:01 PM EST ----- Regarding: Mother of patient Jeremy Gallagher, mother of patient Jeremy Gallagher wanted to speak to Dr. Megan Salon regarding viral load levels and to see If Dr. Megan Salon could talk to someone or send the viral load to the [facility]. She just wanted to let them know that he has been off is medication for awhile and that they would be found liable if anything happens to him in the time he is with out his medication. If she could get a call back from the provider regarding this situation as soon as possible at [phone]

## 2019-03-26 ENCOUNTER — Other Ambulatory Visit: Payer: Self-pay | Admitting: *Deleted

## 2019-03-26 DIAGNOSIS — B20 Human immunodeficiency virus [HIV] disease: Secondary | ICD-10-CM

## 2019-03-26 MED ORDER — TRIUMEQ 600-50-300 MG PO TABS: 1.0000 | ORAL_TABLET | Freq: Every day | ORAL | 1 refills | Status: DC

## 2019-04-23 ENCOUNTER — Other Ambulatory Visit: Payer: Self-pay | Admitting: Internal Medicine

## 2019-04-23 DIAGNOSIS — B20 Human immunodeficiency virus [HIV] disease: Secondary | ICD-10-CM

## 2019-05-06 ENCOUNTER — Ambulatory Visit: Admitting: Internal Medicine

## 2019-06-11 ENCOUNTER — Ambulatory Visit: Admitting: Internal Medicine

## 2019-06-14 ENCOUNTER — Telehealth: Payer: Self-pay

## 2019-06-14 NOTE — Telephone Encounter (Signed)
COVID-19 Pre-Screening Questions:06/14/19  Do you currently have a fever (>100 F), chills or unexplained body aches?NO   Are you currently experiencing new cough, shortness of breath, sore throat, runny nose? NO .  Have you recently travelled outside the state of [place] in the last 14 days?NO .  Have you been in contact with someone that is currently pending confirmation of Covid19 testing or has been confirmed to have the Covid19 virus? NO   **If the patient answers NO to ALL questions -  advise the patient to please call the clinic before coming to the office should any symptoms develop.     

## 2019-06-17 ENCOUNTER — Ambulatory Visit: Admitting: Internal Medicine

## 2019-07-02 ENCOUNTER — Ambulatory Visit: Admitting: Internal Medicine

## 2019-08-08 ENCOUNTER — Ambulatory Visit

## 2019-08-08 ENCOUNTER — Other Ambulatory Visit

## 2019-08-22 ENCOUNTER — Telehealth: Payer: Self-pay | Admitting: *Deleted

## 2019-08-22 ENCOUNTER — Ambulatory Visit: Admitting: Internal Medicine

## 2019-08-22 NOTE — Telephone Encounter (Signed)
RN left message asking patient to reschedule today's missed visit.  He has multiple no-shows with Dr Orvan Falconer, should be scheduled with pharmacy. Andree Coss, RN

## 2020-07-29 ENCOUNTER — Other Ambulatory Visit: Payer: Self-pay

## 2020-07-29 DIAGNOSIS — Z113 Encounter for screening for infections with a predominantly sexual mode of transmission: Secondary | ICD-10-CM

## 2020-07-29 DIAGNOSIS — B20 Human immunodeficiency virus [HIV] disease: Secondary | ICD-10-CM

## 2020-07-29 DIAGNOSIS — Z79899 Other long term (current) drug therapy: Secondary | ICD-10-CM

## 2020-08-04 ENCOUNTER — Ambulatory Visit

## 2020-08-04 ENCOUNTER — Other Ambulatory Visit

## 2020-08-06 ENCOUNTER — Other Ambulatory Visit: Payer: Self-pay

## 2020-08-06 ENCOUNTER — Ambulatory Visit: Payer: Self-pay

## 2020-08-06 DIAGNOSIS — Z79899 Other long term (current) drug therapy: Secondary | ICD-10-CM

## 2020-08-06 DIAGNOSIS — Z113 Encounter for screening for infections with a predominantly sexual mode of transmission: Secondary | ICD-10-CM

## 2020-08-06 DIAGNOSIS — B20 Human immunodeficiency virus [HIV] disease: Secondary | ICD-10-CM

## 2020-08-07 ENCOUNTER — Encounter: Payer: Self-pay | Admitting: Internal Medicine

## 2020-08-07 LAB — T-HELPER CELL (CD4) - (RCID CLINIC ONLY)
CD4 % Helper T Cell: 13 % — ABNORMAL LOW (ref 33–65)
CD4 T Cell Abs: 266 /uL — ABNORMAL LOW (ref 400–1790)

## 2020-08-08 LAB — COMPLETE METABOLIC PANEL WITH GFR
AG Ratio: 1.5 (calc) (ref 1.0–2.5)
ALT: 15 U/L (ref 9–46)
AST: 24 U/L (ref 10–40)
Albumin: 4.6 g/dL (ref 3.6–5.1)
Alkaline phosphatase (APISO): 71 U/L (ref 36–130)
BUN: 11 mg/dL (ref 7–25)
CO2: 27 mmol/L (ref 20–32)
Calcium: 9.7 mg/dL (ref 8.6–10.3)
Chloride: 101 mmol/L (ref 98–110)
Creat: 1 mg/dL (ref 0.60–1.35)
GFR, Est African American: 117 mL/min/{1.73_m2} (ref >=60)
GFR, Est Non African American: 101 mL/min/{1.73_m2} (ref >=60)
Globulin: 3.1 g/dL (calc) (ref 1.9–3.7)
Glucose, Bld: 97 mg/dL (ref 65–99)
Potassium: 4.5 mmol/L (ref 3.5–5.3)
Sodium: 138 mmol/L (ref 135–146)
Total Bilirubin: 0.4 mg/dL (ref 0.2–1.2)
Total Protein: 7.7 g/dL (ref 6.1–8.1)

## 2020-08-08 LAB — CBC WITH DIFFERENTIAL/PLATELET
Absolute Monocytes: 388 cells/uL (ref 200–950)
Basophils Absolute: 17 cells/uL (ref 0–200)
Basophils Relative: 0.3 %
Eosinophils Absolute: 120 cells/uL (ref 15–500)
Eosinophils Relative: 2.1 %
HCT: 41.6 % (ref 38.5–50.0)
Hemoglobin: 13.2 g/dL (ref 13.2–17.1)
Lymphs Abs: 2291 cells/uL (ref 850–3900)
MCH: 26.6 pg — ABNORMAL LOW (ref 27.0–33.0)
MCHC: 31.7 g/dL — ABNORMAL LOW (ref 32.0–36.0)
MCV: 83.7 fL (ref 80.0–100.0)
MPV: 9 fL (ref 7.5–12.5)
Monocytes Relative: 6.8 %
Neutro Abs: 2884 cells/uL (ref 1500–7800)
Neutrophils Relative %: 50.6 %
Platelets: 318 10*3/uL (ref 140–400)
RBC: 4.97 10*6/uL (ref 4.20–5.80)
RDW: 14.8 % (ref 11.0–15.0)
Total Lymphocyte: 40.2 %
WBC: 5.7 10*3/uL (ref 3.8–10.8)

## 2020-08-08 LAB — LIPID PANEL
Cholesterol: 162 mg/dL (ref <200)
HDL: 32 mg/dL — ABNORMAL LOW (ref >=40)
LDL Cholesterol (Calc): 111 mg/dL (calc) — ABNORMAL HIGH
Non-HDL Cholesterol (Calc): 130 mg/dL (calc) — ABNORMAL HIGH (ref <130)
Total CHOL/HDL Ratio: 5.1 (calc) — ABNORMAL HIGH (ref <5.0)
Triglycerides: 90 mg/dL (ref <150)

## 2020-08-08 LAB — HIV-1 RNA QUANT-NO REFLEX-BLD
HIV 1 RNA Quant: 14200 Copies/mL — ABNORMAL HIGH
HIV-1 RNA Quant, Log: 4.15 Log cps/mL — ABNORMAL HIGH

## 2020-08-08 LAB — RPR: RPR Ser Ql: NONREACTIVE

## 2020-08-13 ENCOUNTER — Other Ambulatory Visit: Payer: Self-pay | Admitting: Internal Medicine

## 2020-08-13 DIAGNOSIS — B20 Human immunodeficiency virus [HIV] disease: Secondary | ICD-10-CM

## 2020-08-18 ENCOUNTER — Encounter: Payer: Self-pay | Admitting: Internal Medicine

## 2020-08-25 ENCOUNTER — Ambulatory Visit: Admitting: Internal Medicine

## 2020-09-01 ENCOUNTER — Ambulatory Visit: Payer: Self-pay | Admitting: Internal Medicine

## 2020-11-05 ENCOUNTER — Telehealth: Payer: Self-pay

## 2020-11-05 NOTE — Telephone Encounter (Signed)
Called patient to reschedule missed appointment. Mobile number is not in service, left HIPAA compliant voicemail on home number requesting call back.   Patient's insurance is listed as [facility], RN called [employer] jail, patient is not currently in custody.   Sandie Ano, RN

## 2020-11-17 ENCOUNTER — Ambulatory Visit: Payer: Self-pay | Admitting: Internal Medicine

## 2020-11-17 ENCOUNTER — Ambulatory Visit: Payer: Self-pay

## 2020-11-20 ENCOUNTER — Ambulatory Visit: Payer: Self-pay

## 2020-11-20 ENCOUNTER — Encounter: Payer: Self-pay | Admitting: Internal Medicine

## 2020-11-20 ENCOUNTER — Other Ambulatory Visit: Payer: Self-pay

## 2020-11-20 ENCOUNTER — Ambulatory Visit (INDEPENDENT_AMBULATORY_CARE_PROVIDER_SITE_OTHER): Payer: Self-pay | Admitting: Pharmacist

## 2020-11-20 DIAGNOSIS — B20 Human immunodeficiency virus [HIV] disease: Secondary | ICD-10-CM

## 2020-11-20 MED ORDER — TRIUMEQ 600-50-300 MG PO TABS: 1.0000 | ORAL_TABLET | Freq: Every day | ORAL | 2 refills | Status: DC

## 2020-11-20 NOTE — Progress Notes (Signed)
HPI: Jeremy Gallagher is a 30 y.o. male who presents to the [facility] pharmacy clinic for HIV follow-up.  Patient Active Problem List   Diagnosis Date Noted   HIV disease (HCC) 07/22/2015   Dyslipidemia 07/22/2015   Elevated liver enzymes 07/22/2015   Generalized anxiety disorder 07/22/2015   Polysubstance abuse (HCC) 07/22/2015   Attention deficit hyperactivity disorder (ADHD) 07/14/2015   Perirectal cyst 07/14/2015   ALLERGIC RHINITIS 11/22/2007    Patient's Medications  New Prescriptions   No medications on file  Previous Medications   AMPHETAMINE-DEXTROAMPHETAMINE (ADDERALL) 30 MG TABLET    Take 30 mg by mouth daily.   CLONAZEPAM (KLONOPIN) 1 MG TABLET    Take 1 mg by mouth daily.    IBUPROFEN (ADVIL,MOTRIN) 200 MG TABLET    Take 800 mg by mouth every 6 (six) hours as needed for headache, mild pain or moderate pain. Reported on 07/14/2015   NAPROXEN (NAPROSYN) 500 MG TABLET    Take 1 tablet (500 mg total) by mouth 2 (two) times daily.   TRIUMEQ 600-50-300 MG TABLET    TAKE 1 TABLET BY MOUTH DAILY  Modified Medications   No medications on file  Discontinued Medications   No medications on file    Allergies: No Known Allergies  Past Medical History: Past Medical History:  Diagnosis Date   Allergy    Anxiety    Depression    HIV infection (HCC)     Social History: Social History   Socioeconomic History   Marital status: Single    Spouse name: Not on file   Number of children: Not on file   Years of education: Not on file   Highest education level: Not on file  Occupational History   Not on file  Tobacco Use   Smoking status: Never   Smokeless tobacco: Never  Vaping Use   Vaping Use: Never used  Substance and Sexual Activity   Alcohol use: Yes    Alcohol/week: 4.0 standard drinks    Types: 4 Shots of liquor per week    Comment: drinking increased since HIV diagnosis    Drug use: No   Sexual activity: Yes    Partners: Female, Male    Birth  control/protection: None    Comment: declined condoms  Other Topics Concern   Not on file  Social History Narrative   Not on file   Social Determinants of Health   Financial Resource Strain: Not on file  Food Insecurity: Not on file  Transportation Needs: Not on file  Physical Activity: Not on file  Stress: Not on file  Social Connections: Not on file    Labs: Lab Results  Component Value Date   HIV1RNAQUANT 14,200 (H) 08/06/2020   HIV1RNAQUANT 11,900 (H) 02/25/2019   HIV1RNAQUANT 183 (H) 02/26/2018   CD4TABS 266 (L) 08/06/2020   CD4TABS 464 02/25/2019   CD4TABS 570 02/26/2018    RPR and STI Lab Results  Component Value Date   LABRPR NON-REACTIVE 08/06/2020   LABRPR NON-REACTIVE 02/25/2019   LABRPR NON-REACTIVE 02/26/2018   LABRPR NON REAC 04/19/2016   LABRPR NON REAC 07/07/2015    STI Results GC CT  02/25/2019 Negative Negative  07/08/2015 Negative Negative    Hepatitis B Lab Results  Component Value Date   HEPBSAB INDETER (A) 07/07/2015   HEPBSAG NEGATIVE 07/07/2015   HEPBCAB NON REACTIVE 07/07/2015   Hepatitis C No results found for: HEPCAB, HCVRNAPCRQN Hepatitis A Lab Results  Component Value Date   HAV NON  REACTIVE 07/07/2015   Lipids: Lab Results  Component Value Date   CHOL 162 08/06/2020   TRIG 90 08/06/2020   HDL 32 (L) 08/06/2020   CHOLHDL 5.1 (H) 08/06/2020   VLDL 18 04/19/2016   LDLCALC 111 (H) 08/06/2020    Current HIV Regimen: Triumeq  Assessment: Jeremy Gallagher is here today to follow up for his HIV infection. He has been out of care and has missed several appointments with our clinic. He was last seen in November 2020 by Dr. Orvan Falconer. He reports that he has had trouble with housing and the legal system lately and that has caused him to be out of care. He has been off of Triumeq for over 6 months and cannot remember the last time he took a dose. His Jeremy Gallagher is approved, and he reapplied today for the next round.   No issues or  concerns today. He has a car and housing now and is ready to get back on track. No concerns for any opportunistic infections or STIs. He has only had one partner for the last 6 years. He did have labs drawn in April that showed a HIV viral load of 14,200. Will defer checking labs today since he has not been on medications. Will refill Triumeq and send to [facility]. He will go pick it up today when he leaves. Will have him see Dr. Orvan Falconer in about 6 weeks for follow up.  Plan: - Refill Triumeq - F/u wth Dr. Orvan Falconer in 6 weeks  [name] L. Viridiana Spaid, PharmD, BCIDP, AAHIVP, CPP Clinical Pharmacist Practitioner Infectious Diseases Clinical Pharmacist [facility] 11/20/2020, 10:49 AM

## 2020-12-29 ENCOUNTER — Ambulatory Visit: Payer: Self-pay | Admitting: Internal Medicine

## 2021-01-12 ENCOUNTER — Ambulatory Visit: Payer: Self-pay | Admitting: Internal Medicine

## 2021-02-02 ENCOUNTER — Ambulatory Visit (INDEPENDENT_AMBULATORY_CARE_PROVIDER_SITE_OTHER): Payer: Self-pay | Admitting: Internal Medicine

## 2021-02-02 ENCOUNTER — Other Ambulatory Visit: Payer: Self-pay

## 2021-02-02 ENCOUNTER — Encounter: Payer: Self-pay | Admitting: Internal Medicine

## 2021-02-02 DIAGNOSIS — B20 Human immunodeficiency virus [HIV] disease: Secondary | ICD-10-CM

## 2021-02-02 DIAGNOSIS — F1910 Other psychoactive substance abuse, uncomplicated: Secondary | ICD-10-CM

## 2021-02-02 DIAGNOSIS — B86 Scabies: Secondary | ICD-10-CM

## 2021-02-02 MED ORDER — DIPHENHYDRAMINE HCL 25 MG PO CAPS: 25.0000 mg | ORAL_CAPSULE | Freq: Four times a day (QID) | ORAL | 0 refills | Status: AC | PRN

## 2021-02-02 MED ORDER — PERMETHRIN 5 % EX CREA: TOPICAL_CREAM | CUTANEOUS | 1 refills | Status: DC

## 2021-02-02 NOTE — Progress Notes (Signed)
Patient Active Problem List   Diagnosis Date Noted   HIV disease (HCC) 07/22/2015    Priority: 1.   Scabies 02/02/2021   Dyslipidemia 07/22/2015   Elevated liver enzymes 07/22/2015   Generalized anxiety disorder 07/22/2015   Polysubstance abuse (HCC) 07/22/2015   Attention deficit hyperactivity disorder (ADHD) 07/14/2015   Perirectal cyst 07/14/2015   ALLERGIC RHINITIS 11/22/2007    Patient's Medications  New Prescriptions   DIPHENHYDRAMINE (BENADRYL) 25 MG CAPSULE    Take 1 capsule (25 mg total) by mouth every 6 (six) hours as needed for itching.   PERMETHRIN (ELIMITE) 5 % CREAM    Apply from the neck down and leave cream on overnight before bathing.  Repeat in 1 week.  Previous Medications   ABACAVIR-DOLUTEGRAVIR-LAMIVUDINE (TRIUMEQ) 600-50-300 MG TABLET    Take 1 tablet by mouth daily.   AMPHETAMINE-DEXTROAMPHETAMINE (ADDERALL) 30 MG TABLET    Take 30 mg by mouth daily.   CLONAZEPAM (KLONOPIN) 1 MG TABLET    Take 1 mg by mouth daily.   IBUPROFEN (ADVIL,MOTRIN) 200 MG TABLET    Take 800 mg by mouth every 6 (six) hours as needed for headache, mild pain or moderate pain. Reported on 07/14/2015   NAPROXEN (NAPROSYN) 500 MG TABLET    Take 1 tablet (500 mg total) by mouth 2 (two) times daily.  Modified Medications   No medications on file  Discontinued Medications   No medications on file    Subjective: Jeremy Gallagher is in for his routine HIV follow-up visit.  He says that he has had no problems obtaining, taking or tolerating Triumeq.  He says that his cell phone alarm is set to remind him to take it.  He does not recall missing doses.  When he was here last he was incarcerated.  He is now out of jail and all charges have been settled.  He has no pending court dates and is not on parole.  His only drug use has been occasional marijuana.  He is still smoking cigarettes.  He lives with his boyfriend and his uncle.  His uncle was diagnosed with scabies about 2 weeks ago.  He went  to urgent care and was given a pill (? ivermectin).  His ankle is feeling better but Giann and his boyfriend have started having intense pruritus.  It is most noticeable around his hands wrists feet and in his groin.  Review of Systems: Review of Systems  Constitutional:  Negative for fever.  Respiratory:  Negative for cough and shortness of breath.   Cardiovascular:  Negative for chest pain.  Gastrointestinal:  Negative for abdominal pain, diarrhea, nausea and vomiting.  Genitourinary:  Negative for dysuria.  Skin:  Positive for itching and rash.  Psychiatric/Behavioral:  Positive for depression.    Past Medical History:  Diagnosis Date   Allergy    Anxiety    Depression    HIV infection (HCC)     Social History   Tobacco Use   Smoking status: Never   Smokeless tobacco: Never  Vaping Use   Vaping Use: Never used  Substance Use Topics   Alcohol use: Yes    Alcohol/week: 4.0 standard drinks    Types: 4 Shots of liquor per week    Comment: drinking increased since HIV diagnosis    Drug use: No    Family History  Problem Relation Age of Onset   Hypertension Mother    Cancer Maternal Aunt  Diabetes Maternal Aunt    Hypertension Maternal Aunt     Allergies  Allergen Reactions   Morphine Anaphylaxis    Health Maintenance  Topic Date Due   COVID-19 Vaccine (1) Never done   INFLUENZA VACCINE  11/16/2020   TETANUS/TDAP  10/20/2023   HPV VACCINES  Completed   Hepatitis C Screening  Completed   HIV Screening  Completed    Objective:  Vitals:   02/02/21 1615  BP: (!) 153/85  Pulse: (!) 118  Temp: (!) 97.5 F (36.4 C)  TempSrc: Temporal  Weight: 156 lb (70.8 kg)   Body mass index is 26.78 kg/m.  Physical Exam Constitutional:      Comments: He is obviously uncomfortable due to itching but he is very pleasant.  Cardiovascular:     Rate and Rhythm: Normal rate and regular rhythm.     Heart sounds: No murmur heard. Pulmonary:     Effort: Pulmonary  effort is normal.     Breath sounds: Normal breath sounds.  Skin:    Findings: Rash present.     Comments: He has a red papules on his feet, ankles, hands, wrist and in his groin.  There are extensive areas of excoriation.  Psychiatric:        Mood and Affect: Mood normal.    Lab Results Lab Results  Component Value Date   WBC 5.7 08/06/2020   HGB 13.2 08/06/2020   HCT 41.6 08/06/2020   MCV 83.7 08/06/2020   PLT 318 08/06/2020    Lab Results  Component Value Date   CREATININE 1.00 08/06/2020   BUN 11 08/06/2020   NA 138 08/06/2020   K 4.5 08/06/2020   CL 101 08/06/2020   CO2 27 08/06/2020    Lab Results  Component Value Date   ALT 15 08/06/2020   AST 24 08/06/2020   ALKPHOS 57 07/05/2016   BILITOT 0.4 08/06/2020    Lab Results  Component Value Date   CHOL 162 08/06/2020   HDL 32 (L) 08/06/2020   LDLCALC 111 (H) 08/06/2020   TRIG 90 08/06/2020   CHOLHDL 5.1 (H) 08/06/2020   Lab Results  Component Value Date   LABRPR NON-REACTIVE 08/06/2020   HIV 1 RNA Quant  Date Value  08/06/2020 14,200 Copies/mL (H)  02/25/2019 11,900 copies/mL (H)  02/26/2018 183 copies/mL (H)   CD4 T Cell Abs (/uL)  Date Value  08/06/2020 266 (L)  02/25/2019 464  02/26/2018 570     Problem List Items Addressed This Visit       1.   HIV disease (HCC)    He will continue Triumeq and follow-up in 2 weeks for repeat blood work.        Unprioritized   Polysubstance abuse (HCC)    It seems as though he has curtailed his drug use.      Scabies    I instructed him on treatment with permethrin cream as well as washing all clothes, bedding and towels.  His boyfriend will need to be treated simultaneously.  He will follow-up in 2 weeks.      Relevant Medications   permethrin (ELIMITE) 5 % cream   diphenhydrAMINE (BENADRYL) 25 mg capsule      Cliffton Asters, MD [facility] [phone] pager   [phone]  cell 02/02/2021, 4:57 PM

## 2021-02-02 NOTE — Assessment & Plan Note (Signed)
I instructed him on treatment with permethrin cream as well as washing all clothes, bedding and towels.  His boyfriend will need to be treated simultaneously.  He will follow-up in 2 weeks.

## 2021-02-02 NOTE — Assessment & Plan Note (Signed)
He will continue Triumeq and follow-up in 2 weeks for repeat blood work.

## 2021-02-02 NOTE — Assessment & Plan Note (Signed)
It seems as though he has curtailed his drug use.

## 2021-02-03 ENCOUNTER — Telehealth: Payer: Self-pay | Admitting: *Deleted

## 2021-02-03 NOTE — Telephone Encounter (Signed)
Patient called and asked for Dr Orvan Falconer to prescribe ivermectin for his current scabies outbreak. He lives in the same house with his uncle, mother, and grandmother. His uncle, mother, and grandmother were all prescribed ivermectin and their symptoms have resolved.  He and his boyfriend are the only ones in the household who still have symptoms - they are currently quarantining away from the rest of the family. He states he has used permetherin twice (was given a prescription for this earlier from urgent care) and is not getting relief. He states he is following the directions and washing his belongings. He would like to try the Ivermectin to see if it will work. He is not sure if his ADAP has been approved, but his uncle offered to pay for the prescription out of pocket if it is sent to [facility] at [address].  Please advise. Andree Coss, RN

## 2021-02-04 ENCOUNTER — Other Ambulatory Visit: Payer: Self-pay | Admitting: Internal Medicine

## 2021-02-04 DIAGNOSIS — B86 Scabies: Secondary | ICD-10-CM

## 2021-02-04 MED ORDER — IVERMECTIN 3 MG PO TABS: ORAL_TABLET | ORAL | 0 refills | Status: DC

## 2021-02-16 ENCOUNTER — Ambulatory Visit: Payer: Self-pay | Admitting: Internal Medicine

## 2021-03-30 ENCOUNTER — Ambulatory Visit: Payer: Self-pay | Admitting: Internal Medicine

## 2021-04-27 ENCOUNTER — Telehealth: Payer: Self-pay

## 2021-04-27 ENCOUNTER — Other Ambulatory Visit: Payer: Self-pay | Admitting: Pharmacist

## 2021-04-27 DIAGNOSIS — B20 Human immunodeficiency virus [HIV] disease: Secondary | ICD-10-CM

## 2021-04-27 NOTE — Telephone Encounter (Signed)
Called patient to get missed appointment rescheduled, left patient a voicemail to call back and get rescheduled.

## 2021-04-27 NOTE — Telephone Encounter (Signed)
Left patient a voice mail to call back to reschedule missed appointment with Dr. Campbell  °

## 2021-04-27 NOTE — Telephone Encounter (Signed)
-----   Message from Juanita Laster, [place] sent at 04/27/2021  8:48 AM EST ----- Regarding: appt Howdy, Can someone schedule a follow up appt for this pt. Looks like he missed it.Jeremy KitchenMarland KitchenMarland Gallagher

## 2021-05-12 ENCOUNTER — Telehealth: Payer: Self-pay

## 2021-05-12 NOTE — Telephone Encounter (Signed)
Left patient a voice mail to call back to reschedule missed appointment with Dr. Megan Salon

## 2021-05-12 NOTE — Telephone Encounter (Signed)
-----   Message from Cecelia M Martinez, RMA sent at 04/27/2021  8:48 AM EST ----- °Regarding: appt °Howdy, °Can someone schedule a follow up appt for this pt. Looks like he missed it.... ° ° °

## 2021-06-14 ENCOUNTER — Other Ambulatory Visit (HOSPITAL_COMMUNITY): Payer: Self-pay

## 2021-09-08 ENCOUNTER — Encounter: Payer: Self-pay | Admitting: Infectious Diseases

## 2021-09-27 ENCOUNTER — Telehealth: Payer: Self-pay | Admitting: Internal Medicine

## 2021-09-27 NOTE — Telephone Encounter (Signed)
Pt called office and requested scheduling. Called pt back on the number left [phone]). Unable to reach, for the VM box has not been set up. Will attempt to call pt again at a later time.

## 2021-10-05 ENCOUNTER — Encounter: Payer: Self-pay | Admitting: Internal Medicine

## 2021-10-05 ENCOUNTER — Other Ambulatory Visit: Payer: Self-pay

## 2021-10-05 ENCOUNTER — Ambulatory Visit: Payer: Self-pay

## 2021-10-05 ENCOUNTER — Ambulatory Visit (INDEPENDENT_AMBULATORY_CARE_PROVIDER_SITE_OTHER): Payer: Self-pay | Admitting: Internal Medicine

## 2021-10-05 DIAGNOSIS — B20 Human immunodeficiency virus [HIV] disease: Secondary | ICD-10-CM

## 2021-10-05 DIAGNOSIS — A529 Late syphilis, unspecified: Secondary | ICD-10-CM

## 2021-10-05 DIAGNOSIS — F1910 Other psychoactive substance abuse, uncomplicated: Secondary | ICD-10-CM

## 2021-10-05 DIAGNOSIS — B86 Scabies: Secondary | ICD-10-CM

## 2021-10-05 MED ORDER — TRIUMEQ 600-50-300 MG PO TABS: 1.0000 | ORAL_TABLET | Freq: Every day | ORAL | 5 refills | Status: DC

## 2021-10-05 MED ORDER — BICTEGRAVIR-EMTRICITAB-TENOFOV 50-200-25 MG PO TABS: 1.0000 | ORAL_TABLET | Freq: Every day | ORAL | 0 refills | Status: AC

## 2021-10-05 NOTE — Progress Notes (Signed)
Medication Samples have been provided to the patient.  Drug name: Biktarvy        Strength: 50/200/25 mg       Qty: 14 tablets (2 bottles) LOT: [id number]   Exp.Date: 9/25  Dosing instructions: Take one tablet by mouth once daily  The patient has been instructed regarding the correct time, dose, and frequency of taking this medication, including desired effects and most common side effects.   Haddie Bruhl, PharmD, CPP Clinical Pharmacist Practitioner Infectious Diseases Clinical Pharmacist [facility]  

## 2021-10-05 NOTE — Assessment & Plan Note (Signed)
His scabies has resolved.

## 2021-10-05 NOTE — Assessment & Plan Note (Signed)
He had a relapse of heroin use but is now 19 days sober.  He has motivated to continue Suboxone and to stay in drug treatment.

## 2021-10-05 NOTE — Addendum Note (Signed)
Addended by: Cliffton Asters on: 10/05/2021 02:46 PM   Modules accepted: Orders

## 2021-10-05 NOTE — Assessment & Plan Note (Signed)
He will get blood work today and renew his ADAP.  I gave him 2 weeks worth of Biktarvy samples to take until his Triumeq is renewed.  He will follow-up in 6 weeks.

## 2021-10-05 NOTE — Progress Notes (Signed)
Patient Active Problem List   Diagnosis Date Noted   HIV disease (HCC) 07/22/2015    Priority: High   Scabies 02/02/2021   Dyslipidemia 07/22/2015   Elevated liver enzymes 07/22/2015   Generalized anxiety disorder 07/22/2015   Polysubstance abuse (HCC) 07/22/2015   Attention deficit hyperactivity disorder (ADHD) 07/14/2015   Perirectal cyst 07/14/2015   ALLERGIC RHINITIS 11/22/2007    Patient's Medications  New Prescriptions   BICTEGRAVIR-EMTRICITABINE-TENOFOVIR AF (BIKTARVY) 50-200-25 MG TABS TABLET    Take 1 tablet by mouth daily for 14 days.  Previous Medications   AMPHETAMINE-DEXTROAMPHETAMINE (ADDERALL) 30 MG TABLET    Take 30 mg by mouth daily.   CLONAZEPAM (KLONOPIN) 1 MG TABLET    Take 1 mg by mouth daily.   DIPHENHYDRAMINE (BENADRYL) 25 MG CAPSULE    Take 1 capsule (25 mg total) by mouth every 6 (six) hours as needed for itching.   IBUPROFEN (ADVIL,MOTRIN) 200 MG TABLET    Take 800 mg by mouth every 6 (six) hours as needed for headache, mild pain or moderate pain. Reported on 07/14/2015   IVERMECTIN (STROMECTOL) 3 MG TABS TABLET    Take 4 tablets by mouth today.  Repeat 4 tablets by mouth in 1 week.   NAPROXEN (NAPROSYN) 500 MG TABLET    Take 1 tablet (500 mg total) by mouth 2 (two) times daily.  Modified Medications   Modified Medication Previous Medication   ABACAVIR-DOLUTEGRAVIR-LAMIVUDINE (TRIUMEQ) 600-50-300 MG TABLET TRIUMEQ 600-50-300 MG tablet      Take 1 tablet by mouth daily.    TAKE 1 TABLET BY MOUTH DAILY  Discontinued Medications   No medications on file    Subjective: Jeremy Gallagher is in today with his mother, [name].  He has been off of his Triumeq for several months and was taking it sporadically before he quit.  He says that he was actively snorting heroin and has been addicted for many many years.  He is now 19 days sober and eager to get back on treatment.  He is on Suboxone and gabapentin.  He is in drug treatment with [facility].  He denies feeling  anxious or depressed.  His itching stopped promptly after I treated him for scabies last year.  He is currently homeless but living with his grandmother in [place].  He does not have stable transportation.  Review of Systems: Review of Systems  Constitutional:  Negative for fever and weight loss.  Psychiatric/Behavioral:  Positive for substance abuse. Negative for depression. The patient is not nervous/anxious.     Past Medical History:  Diagnosis Date   Allergy    Anxiety    Depression    HIV infection (HCC)     Social History   Tobacco Use   Smoking status: Never   Smokeless tobacco: Never  Vaping Use   Vaping Use: Never used  Substance Use Topics   Alcohol use: Yes    Alcohol/week: 4.0 standard drinks of alcohol    Types: 4 Shots of liquor per week    Comment: drinking increased since HIV diagnosis    Drug use: No    Family History  Problem Relation Age of Onset   Hypertension Mother    Cancer Maternal Aunt    Diabetes Maternal Aunt    Hypertension Maternal Aunt     Allergies  Allergen Reactions   Morphine Anaphylaxis    Health Maintenance  Topic Date Due   COVID-19 Vaccine (1) Never done  INFLUENZA VACCINE  11/16/2021   TETANUS/TDAP  10/20/2023   HPV VACCINES  Completed   Hepatitis C Screening  Completed   HIV Screening  Completed    Objective:  Vitals:   10/05/21 1354  BP: 131/88  Pulse: (!) 102  Temp: 98.7 F (37.1 C)  TempSrc: Oral  Weight: 140 lb (63.5 kg)   Body mass index is 24.03 kg/m.  Physical Exam Constitutional:      Comments: He is pleasant but anxious and very talkative.  He received multiple phone calls during our visit.  Cardiovascular:     Rate and Rhythm: Normal rate and regular rhythm.     Heart sounds: No murmur heard. Pulmonary:     Effort: Pulmonary effort is normal.     Breath sounds: Normal breath sounds.  Skin:    Findings: No rash.  Psychiatric:        Mood and Affect: Mood normal.     Lab  Results Lab Results  Component Value Date   WBC 5.7 08/06/2020   HGB 13.2 08/06/2020   HCT 41.6 08/06/2020   MCV 83.7 08/06/2020   PLT 318 08/06/2020    Lab Results  Component Value Date   CREATININE 1.00 08/06/2020   BUN 11 08/06/2020   NA 138 08/06/2020   K 4.5 08/06/2020   CL 101 08/06/2020   CO2 27 08/06/2020    Lab Results  Component Value Date   ALT 15 08/06/2020   AST 24 08/06/2020   ALKPHOS 57 07/05/2016   BILITOT 0.4 08/06/2020    Lab Results  Component Value Date   CHOL 162 08/06/2020   HDL 32 (L) 08/06/2020   LDLCALC 111 (H) 08/06/2020   TRIG 90 08/06/2020   CHOLHDL 5.1 (H) 08/06/2020   Lab Results  Component Value Date   LABRPR NON-REACTIVE 08/06/2020   HIV 1 RNA Quant  Date Value  08/06/2020 14,200 Copies/mL (H)  02/25/2019 11,900 copies/mL (H)  02/26/2018 183 copies/mL (H)   CD4 T Cell Abs (/uL)  Date Value  08/06/2020 266 (L)  02/25/2019 464  02/26/2018 570     Problem List Items Addressed This Visit       High   HIV disease (HCC)    He will get blood work today and renew his ADAP.  I gave him 2 weeks worth of Biktarvy samples to take until his Triumeq is renewed.  He will follow-up in 6 weeks.      Relevant Medications   abacavir-dolutegravir-lamiVUDine (TRIUMEQ) 600-50-300 MG tablet   bictegravir-emtricitabine-tenofovir AF (BIKTARVY) 50-200-25 MG TABS tablet   Other Relevant Orders   T-helper cells (CD4) count (not at [facility])   HIV-1 RNA quant-no reflex-bld   CBC   Comprehensive metabolic panel   RPR   Lipid panel   HIV-1 genotypr plus   HIV-1 Integrase Genotype     Unprioritized   Polysubstance abuse (HCC)    He had a relapse of heroin use but is now 19 days sober.  He has motivated to continue Suboxone and to stay in drug treatment.      Scabies    His scabies has resolved.      Relevant Medications   abacavir-dolutegravir-lamiVUDine (TRIUMEQ) 600-50-300 MG tablet   bictegravir-emtricitabine-tenofovir AF (BIKTARVY)  50-200-25 MG TABS tablet   Other Visit Diagnoses     Human immunodeficiency virus (HIV) disease (HCC)       Relevant Medications   abacavir-dolutegravir-lamiVUDine (TRIUMEQ) 600-50-300 MG tablet   bictegravir-emtricitabine-tenofovir AF (BIKTARVY) 50-200-25 MG TABS  tablet         Cliffton Asters, MD [facility] for Infectious Disease [facility] [phone] pager   [phone] cell 10/05/2021, 2:21 PM

## 2021-10-06 LAB — T-HELPER CELLS (CD4) COUNT (NOT AT ARMC)
CD4 % Helper T Cell: 10 % — ABNORMAL LOW (ref 33–65)
CD4 T Cell Abs: 213 /uL — ABNORMAL LOW (ref 400–1790)

## 2021-10-07 ENCOUNTER — Telehealth: Payer: Self-pay

## 2021-10-07 DIAGNOSIS — A529 Late syphilis, unspecified: Secondary | ICD-10-CM | POA: Insufficient documentation

## 2021-10-07 NOTE — Telephone Encounter (Signed)
-----   Message from Cliffton Asters, MD sent at 10/07/2021  9:18 AM EDT ----- Regarding: Syphilis Bernardo's RPR has gone up to 1:64.  He needs treatment for latent syphilis with benzathine penicillin 2,400,000 units IM weekly x3.  I believe he is currently living with his grandmother in [place] and has trouble with transportation so it may be best to direct him to his local health department.  Thanks. ----- Message ----- From: Interface, [facility] Lab Results In Sent: 10/05/2021  10:52 PM EDT To: Cliffton Asters, MD

## 2021-10-07 NOTE — Telephone Encounter (Signed)
Called patient - unable to leave a message due to VM box not being set up.   Bryn Saline Lesli Albee, CMA

## 2021-10-08 ENCOUNTER — Ambulatory Visit (INDEPENDENT_AMBULATORY_CARE_PROVIDER_SITE_OTHER): Payer: Self-pay

## 2021-10-08 ENCOUNTER — Other Ambulatory Visit: Payer: Self-pay

## 2021-10-08 DIAGNOSIS — A529 Late syphilis, unspecified: Secondary | ICD-10-CM

## 2021-10-08 DIAGNOSIS — B20 Human immunodeficiency virus [HIV] disease: Secondary | ICD-10-CM

## 2021-10-08 MED ORDER — PENICILLIN G BENZATHINE 1200000 UNIT/2ML IM SUSY
1.2000 10*6.[IU] | PREFILLED_SYRINGE | Freq: Once | INTRAMUSCULAR | Status: AC
  Administered 2021-10-08: 1.2 10*6.[IU] via INTRAMUSCULAR

## 2021-10-15 ENCOUNTER — Other Ambulatory Visit: Payer: Self-pay

## 2021-10-15 ENCOUNTER — Ambulatory Visit (INDEPENDENT_AMBULATORY_CARE_PROVIDER_SITE_OTHER): Payer: Self-pay

## 2021-10-15 DIAGNOSIS — A529 Late syphilis, unspecified: Secondary | ICD-10-CM

## 2021-10-15 MED ORDER — PENICILLIN G BENZATHINE 1200000 UNIT/2ML IM SUSY
1.2000 10*6.[IU] | PREFILLED_SYRINGE | Freq: Once | INTRAMUSCULAR | Status: AC
  Administered 2021-10-15: 1.2 10*6.[IU] via INTRAMUSCULAR

## 2021-10-18 LAB — CBC
HCT: 47 % (ref 38.5–50.0)
Hemoglobin: 14.9 g/dL (ref 13.2–17.1)
MCH: 24.9 pg — ABNORMAL LOW (ref 27.0–33.0)
MCHC: 31.7 g/dL — ABNORMAL LOW (ref 32.0–36.0)
MCV: 78.5 fL — ABNORMAL LOW (ref 80.0–100.0)
MPV: 9.3 fL (ref 7.5–12.5)
Platelets: 321 Thousand/uL (ref 140–400)
RBC: 5.99 Million/uL — ABNORMAL HIGH (ref 4.20–5.80)
RDW: 17 % — ABNORMAL HIGH (ref 11.0–15.0)
WBC: 9.2 Thousand/uL (ref 3.8–10.8)

## 2021-10-18 LAB — LIPID PANEL
Cholesterol: 158 mg/dL
HDL: 47 mg/dL
LDL Cholesterol (Calc): 91 mg/dL
Non-HDL Cholesterol (Calc): 111 mg/dL
Total CHOL/HDL Ratio: 3.4 (calc)
Triglycerides: 107 mg/dL

## 2021-10-18 LAB — COMPREHENSIVE METABOLIC PANEL
AG Ratio: 1.4 (calc) (ref 1.0–2.5)
ALT: 9 U/L (ref 9–46)
AST: 18 U/L (ref 10–40)
Albumin: 4.8 g/dL (ref 3.6–5.1)
Alkaline phosphatase (APISO): 68 U/L (ref 36–130)
BUN: 13 mg/dL (ref 7–25)
CO2: 27 mmol/L (ref 20–32)
Calcium: 10.1 mg/dL (ref 8.6–10.3)
Chloride: 102 mmol/L (ref 98–110)
Creat: 1.09 mg/dL (ref 0.60–1.26)
Globulin: 3.5 g/dL (calc) (ref 1.9–3.7)
Glucose, Bld: 92 mg/dL (ref 65–99)
Potassium: 4.3 mmol/L (ref 3.5–5.3)
Sodium: 139 mmol/L (ref 135–146)
Total Bilirubin: 0.3 mg/dL (ref 0.2–1.2)
Total Protein: 8.3 g/dL — ABNORMAL HIGH (ref 6.1–8.1)

## 2021-10-18 LAB — HIV-1 GENOTYPING (RTI,PI,IN INHBTR)
Date Viral Load Collected: 6202023
HIV-1 Genotype: DETECTED — AB

## 2021-10-18 LAB — RPR TITER: RPR Titer: 1:64 {titer} — ABNORMAL HIGH

## 2021-10-18 LAB — HIV-1 RNA QUANT-NO REFLEX-BLD
HIV 1 RNA Quant: 15700 {copies}/mL — ABNORMAL HIGH
HIV-1 RNA Quant, Log: 4.2 {Log_copies}/mL — ABNORMAL HIGH

## 2021-10-18 LAB — FLUORESCENT TREPONEMAL AB(FTA)-IGG-BLD: Fluorescent Treponemal ABS: REACTIVE — AB

## 2021-10-18 LAB — SYPHILIS: RPR W/REFLEX TO RPR TITER AND TREPONEMAL ANTIBODIES, TRADITIONAL SCREENING AND DIAGNOSIS ALGORITHM: RPR Ser Ql: REACTIVE — AB

## 2021-10-22 ENCOUNTER — Telehealth: Payer: Self-pay

## 2021-10-22 ENCOUNTER — Ambulatory Visit: Payer: Self-pay

## 2021-10-22 NOTE — Telephone Encounter (Signed)
Called patient to see if he was still coming for his appointment, if he cannot come before close of clinic today, Monday 7/10 will be the last day in his window for treatment and he will need to come in then.   Sandie Ano, RN

## 2021-10-22 NOTE — Telephone Encounter (Signed)
Called patient to see if he was still coming to today's nurse visit for last dose of Bicillin, no answer and unable to leave voicemail.  Sandie Ano, RN

## 2021-10-22 NOTE — Telephone Encounter (Signed)
Spoke with Reichen, he will be here for his final dose of bicillin today.   Sandie Ano, RN

## 2021-10-25 NOTE — Telephone Encounter (Signed)
Attempted to call patient to schedule appointment for final dose of bicillin today (7/10). No answer and voicemail not set up. Unable to leave voicemail. Will send MyChart message.  Wyvonne Lenz, RN

## 2021-10-26 ENCOUNTER — Other Ambulatory Visit: Payer: Self-pay

## 2021-10-26 ENCOUNTER — Ambulatory Visit: Payer: Self-pay

## 2021-10-26 NOTE — Telephone Encounter (Signed)
Called patient to restart Bicillin series, no answer and voicemail box not set up.   Sandie Ano, RN

## 2021-10-27 NOTE — Telephone Encounter (Signed)
Faxed patient info to Christean Leaf with [facility].   Sandie Ano, RN

## 2021-11-13 ENCOUNTER — Other Ambulatory Visit: Payer: Self-pay | Admitting: Internal Medicine

## 2021-11-13 DIAGNOSIS — B20 Human immunodeficiency virus [HIV] disease: Secondary | ICD-10-CM

## 2021-11-14 ENCOUNTER — Other Ambulatory Visit: Payer: Self-pay | Admitting: Internal Medicine

## 2021-11-14 DIAGNOSIS — B20 Human immunodeficiency virus [HIV] disease: Secondary | ICD-10-CM

## 2021-11-15 NOTE — Telephone Encounter (Signed)
Appointment on 11/16/21

## 2021-11-15 NOTE — Telephone Encounter (Signed)
Appt 8/01 Orvan Falconer

## 2021-11-16 ENCOUNTER — Ambulatory Visit: Payer: Self-pay | Admitting: Internal Medicine

## 2022-01-11 ENCOUNTER — Other Ambulatory Visit: Payer: Self-pay

## 2022-01-11 ENCOUNTER — Ambulatory Visit: Payer: Self-pay

## 2022-01-18 ENCOUNTER — Ambulatory Visit: Payer: Self-pay | Admitting: Internal Medicine

## 2022-01-19 ENCOUNTER — Ambulatory Visit: Payer: Self-pay | Admitting: Internal Medicine

## 2022-01-28 ENCOUNTER — Emergency Department (HOSPITAL_COMMUNITY)
Admission: EM | Admit: 2022-01-28 | Discharge: 2022-01-29 | Disposition: A | Discharge status: Home / Self Care | Payer: Self-pay | Attending: Emergency Medicine | Admitting: Emergency Medicine

## 2022-01-28 ENCOUNTER — Other Ambulatory Visit: Payer: Self-pay

## 2022-01-28 ENCOUNTER — Emergency Department (HOSPITAL_COMMUNITY): Payer: Self-pay

## 2022-01-28 ENCOUNTER — Encounter (HOSPITAL_COMMUNITY): Payer: Self-pay

## 2022-01-28 DIAGNOSIS — B20 Human immunodeficiency virus [HIV] disease: Secondary | ICD-10-CM

## 2022-01-28 DIAGNOSIS — Z20822 Contact with and (suspected) exposure to covid-19: Secondary | ICD-10-CM | POA: Insufficient documentation

## 2022-01-28 DIAGNOSIS — J181 Lobar pneumonia, unspecified organism: Secondary | ICD-10-CM | POA: Insufficient documentation

## 2022-01-28 DIAGNOSIS — D72829 Elevated white blood cell count, unspecified: Secondary | ICD-10-CM | POA: Insufficient documentation

## 2022-01-28 DIAGNOSIS — Z21 Asymptomatic human immunodeficiency virus [HIV] infection status: Secondary | ICD-10-CM | POA: Insufficient documentation

## 2022-01-28 DIAGNOSIS — J189 Pneumonia, unspecified organism: Secondary | ICD-10-CM

## 2022-01-28 LAB — COMPREHENSIVE METABOLIC PANEL
ALT: 14 U/L (ref 0–44)
AST: 21 U/L (ref 15–41)
Albumin: 4 g/dL (ref 3.5–5.0)
Alkaline Phosphatase: 81 U/L (ref 38–126)
Anion gap: 14 (ref 5–15)
BUN: 10 mg/dL (ref 6–20)
CO2: 25 mmol/L (ref 22–32)
Calcium: 10 mg/dL (ref 8.9–10.3)
Chloride: 96 mmol/L — ABNORMAL LOW (ref 98–111)
Creatinine, Ser: 1.04 mg/dL (ref 0.61–1.24)
GFR, Estimated: >60 mL/min (ref >60)
Glucose, Bld: 98 mg/dL (ref 70–99)
Potassium: 4.4 mmol/L (ref 3.5–5.1)
Sodium: 135 mmol/L (ref 135–145)
Total Bilirubin: 0.7 mg/dL (ref 0.3–1.2)
Total Protein: 8.8 g/dL — ABNORMAL HIGH (ref 6.5–8.1)

## 2022-01-28 LAB — CBC WITH DIFFERENTIAL/PLATELET
Abs Immature Granulocytes: 0.06 10*3/uL (ref 0.00–0.07)
Basophils Absolute: 0 10*3/uL (ref 0.0–0.1)
Basophils Relative: 0 %
Eosinophils Absolute: 0 10*3/uL (ref 0.0–0.5)
Eosinophils Relative: 0 %
HCT: 39.5 % (ref 39.0–52.0)
Hemoglobin: 12.9 g/dL — ABNORMAL LOW (ref 13.0–17.0)
Immature Granulocytes: 0 %
Lymphocytes Relative: 17 %
Lymphs Abs: 2.6 10*3/uL (ref 0.7–4.0)
MCH: 27.1 pg (ref 26.0–34.0)
MCHC: 32.7 g/dL (ref 30.0–36.0)
MCV: 83 fL (ref 80.0–100.0)
Monocytes Absolute: 1 10*3/uL (ref 0.1–1.0)
Monocytes Relative: 6 %
Neutro Abs: 11.7 10*3/uL — ABNORMAL HIGH (ref 1.7–7.7)
Neutrophils Relative %: 77 %
Platelets: 427 10*3/uL — ABNORMAL HIGH (ref 150–400)
RBC: 4.76 MIL/uL (ref 4.22–5.81)
RDW: 14.3 % (ref 11.5–15.5)
WBC: 15.4 10*3/uL — ABNORMAL HIGH (ref 4.0–10.5)
nRBC: 0 % (ref 0.0–0.2)

## 2022-01-28 LAB — RESP PANEL BY RT-PCR (FLU A&B, COVID) ARPGX2
Influenza A by PCR: NEGATIVE
Influenza B by PCR: NEGATIVE
SARS Coronavirus 2 by RT PCR: NEGATIVE

## 2022-01-28 LAB — LACTIC ACID, PLASMA
Lactic Acid, Venous: 1.1 mmol/L (ref 0.5–1.9)
Lactic Acid, Venous: 1.2 mmol/L (ref 0.5–1.9)

## 2022-01-28 LAB — TROPONIN I (HIGH SENSITIVITY)
Troponin I (High Sensitivity): 9 ng/L (ref <18)
Troponin I (High Sensitivity): 4 ng/L (ref <18)

## 2022-01-28 MED ORDER — ACETAMINOPHEN 500 MG PO TABS
1000.0000 mg | ORAL_TABLET | Freq: Four times a day (QID) | ORAL | Status: DC | PRN
  Administered 2022-01-28: 1000 mg via ORAL
  Filled 2022-01-28: qty 2

## 2022-01-28 MED ORDER — SODIUM CHLORIDE 0.9 % IV SOLN
1.0000 g | Freq: Once | INTRAVENOUS | Status: AC
  Administered 2022-01-28: 1 g via INTRAVENOUS
  Filled 2022-01-28: qty 10

## 2022-01-28 MED ORDER — AZITHROMYCIN 250 MG PO TABS: 250.0000 mg | ORAL_TABLET | Freq: Every day | ORAL | 0 refills | Status: DC

## 2022-01-28 MED ORDER — SODIUM CHLORIDE 0.9 % IV SOLN
500.0000 mg | Freq: Once | INTRAVENOUS | Status: AC
  Administered 2022-01-28: 500 mg via INTRAVENOUS
  Filled 2022-01-28: qty 5

## 2022-01-28 MED ORDER — AZITHROMYCIN 250 MG PO TABS: 500.0000 mg | ORAL_TABLET | Freq: Once | ORAL | Status: DC

## 2022-01-28 MED ORDER — SODIUM CHLORIDE 0.9 % IV SOLN: 1.0000 g | Freq: Once | INTRAVENOUS | Status: DC

## 2022-01-28 NOTE — Discharge Instructions (Addendum)
Take the antibiotic Zithromycin as directed.  Take Tylenol as needed for fever.  Contact your infectious disease specialist let them know you were diagnosed with pneumonia.  Return for new or worse symptoms to include difficulty breathing.

## 2022-01-28 NOTE — ED Provider Notes (Addendum)
[facility] EMERGENCY DEPARTMENT Provider Note   CSN: [id number] Arrival date & time: 01/28/22  1508     History  Chief Complaint  Patient presents with   Cough    Jeremy Gallagher is a 31 y.o. male.  Patient with a productive cough thick green mucus for a while but today started with fevers temp up to 100.3.  Patient with some chest pain with inspiration on the left side of the chest and some on the right side.  Fevers just started today.  Patient's oxygen saturations here have been good 93% to 97%.  Temp was originally 100.3 currently 99.9.  Not tachycardic respiratory rates 18 blood pressures 119/63.  Patient followed by infectious disease for HIV he is taking antivirals.  Past medical history significant for HIV infection and patient's had trouble with substance abuse in the past.  Currently no drug use.       Home Medications Prior to Admission medications   Medication Sig Start Date End Date Taking? Authorizing Provider  azithromycin (ZITHROMAX) 250 MG tablet Take 1 tablet (250 mg total) by mouth daily. Take first 2 tablets together, then 1 every day until finished. 01/28/22  Yes Vanetta Mulders, MD  abacavir-dolutegravir-lamiVUDine (TRIUMEQ) 600-50-300 MG tablet Take 1 tablet by mouth daily. 10/05/21   Cliffton Asters, MD  amphetamine-dextroamphetamine (ADDERALL) 30 MG tablet Take 30 mg by mouth daily. Patient not taking: Reported on 02/02/2021    [provider]  clonazePAM (KLONOPIN) 1 MG tablet Take 1 mg by mouth daily. Patient not taking: Reported on 02/02/2021 08/21/14   [provider]  diphenhydrAMINE (BENADRYL) 25 mg capsule Take 1 capsule (25 mg total) by mouth every 6 (six) hours as needed for itching. Patient not taking: Reported on 10/05/2021 02/02/21   Cliffton Asters, MD  ibuprofen (ADVIL,MOTRIN) 200 MG tablet Take 800 mg by mouth every 6 (six) hours as needed for headache, mild pain or moderate pain. Reported on  07/14/2015 Patient not taking: Reported on 02/02/2021    [provider]  ivermectin (STROMECTOL) 3 MG TABS tablet Take 4 tablets by mouth today.  Repeat 4 tablets by mouth in 1 week. Patient not taking: Reported on 10/05/2021 02/04/21   Cliffton Asters, MD  naproxen (NAPROSYN) 500 MG tablet Take 1 tablet (500 mg total) by mouth 2 (two) times daily. Patient not taking: Reported on 02/02/2021 09/27/16   Barrett Henle, PA-C      Allergies    Morphine and Succinylcholine    Review of Systems   Review of Systems  Constitutional:  Positive for fever. Negative for chills.  HENT:  Positive for congestion. Negative for ear pain and sore throat.   Eyes:  Negative for pain and visual disturbance.  Respiratory:  Positive for cough and shortness of breath.   Cardiovascular:  Positive for chest pain. Negative for palpitations.  Gastrointestinal:  Negative for abdominal pain and vomiting.  Genitourinary:  Negative for dysuria and hematuria.  Musculoskeletal:  Negative for arthralgias and back pain.  Skin:  Negative for color change and rash.  Neurological:  Negative for seizures and syncope.  All other systems reviewed and are negative.   Physical Exam Updated Vital Signs BP 110/65   Pulse 82   Temp (!) 101.7 F (38.7 C) (Oral)   Resp 16   Ht 1.626 m (5\' 4" )   Wt 71.2 kg   SpO2 98%   BMI 26.95 kg/m  Physical Exam Vitals and nursing note reviewed.  Constitutional:  General: He is not in acute distress.    Appearance: Normal appearance. He is well-developed. He is not ill-appearing or toxic-appearing.  HENT:     Head: Normocephalic and atraumatic.     Mouth/Throat:     Mouth: Mucous membranes are moist.  Eyes:     Extraocular Movements: Extraocular movements intact.     Conjunctiva/sclera: Conjunctivae normal.     Pupils: Pupils are equal, round, and reactive to light.  Cardiovascular:     Rate and Rhythm: Normal rate and regular rhythm.     Heart sounds: No  murmur heard. Pulmonary:     Effort: Pulmonary effort is normal. No respiratory distress.     Breath sounds: Rhonchi present. No wheezing or rales.  Abdominal:     Palpations: Abdomen is soft.     Tenderness: There is no abdominal tenderness.  Musculoskeletal:        General: No swelling.     Cervical back: Normal range of motion and neck supple. No rigidity.  Skin:    General: Skin is warm and dry.     Capillary Refill: Capillary refill takes less than 2 seconds.  Neurological:     General: No focal deficit present.     Mental Status: He is alert and oriented to person, place, and time.     Cranial Nerves: No cranial nerve deficit.     Sensory: No sensory deficit.     Motor: No weakness.  Psychiatric:        Mood and Affect: Mood normal.     ED Results / Procedures / Treatments   Labs (all labs ordered are listed, but only abnormal results are displayed) Labs Reviewed  COMPREHENSIVE METABOLIC PANEL - Abnormal; Notable for the following components:      Result Value   Chloride 96 (*)    Total Protein 8.8 (*)    All other components within normal limits  CBC WITH DIFFERENTIAL/PLATELET - Abnormal; Notable for the following components:   WBC 15.4 (*)    Hemoglobin 12.9 (*)    Platelets 427 (*)    Neutro Abs 11.7 (*)    All other components within normal limits  RESP PANEL BY RT-PCR (FLU A&B, COVID) ARPGX2  CULTURE, BLOOD (ROUTINE X 2)  CULTURE, BLOOD (ROUTINE X 2)  LACTIC ACID, PLASMA  LACTIC ACID, PLASMA  TROPONIN I (HIGH SENSITIVITY)  TROPONIN I (HIGH SENSITIVITY)    EKG EKG Interpretation  Date/Time:  Friday January 28 2022 17:41:31 EDT Ventricular Rate:  76 PR Interval:  126 QRS Duration: 102 QT Interval:  390 QTC Calculation: 438 R Axis:   100 Text Interpretation: Normal sinus rhythm Rightward axis Borderline ECG When compared with ECG of 05-Aug-2017 14:42, PREVIOUS ECG IS PRESENT Confirmed by Vanetta Mulders [phone]) on 01/28/2022 11:00:33  PM  Radiology DG Chest 2 View  Result Date: 01/28/2022 CLINICAL DATA:  Cough.  Chest pain with inspiration. EXAM: CHEST - 2 VIEW COMPARISON:  Chest two views 08/05/2017 FINDINGS: Cardiac silhouette and mediastinal contours are within normal limits. New heterogeneous airspace opacity within the lower lung on frontal view favored to be within the lingula on lateral view. Additional very subtle right basilar airspace density, possible mild posterior right lower lobe airspace opacity. No pleural effusion pneumothorax. No acute skeletal abnormality. IMPRESSION: 1. Lingular heterogeneous airspace opacity suspicious for pneumonia. 2. Possible faint opacity within the right lower lung. 3. Recommend follow-up chest radiographs 6 weeks after treatment to ensure resolution of these findings. Electronically Signed   By:  Yvonne Kendall M.D.   On: 01/28/2022 18:13    Procedures Procedures    Medications Ordered in ED Medications  cefTRIAXone (ROCEPHIN) 1 g in sodium chloride 0.9 % 100 mL IVPB (1 g Intravenous New Bag/Given 01/28/22 2250)  azithromycin (ZITHROMAX) 500 mg in sodium chloride 0.9 % 250 mL IVPB (500 mg Intravenous New Bag/Given 01/28/22 2252)    ED Course/ Medical Decision Making/ A&P                           Medical Decision Making Risk OTC drugs. Prescription drug management.   EKG reviewed by me.  No acute changes.  Sinus rhythm.  Chest x-ray seems to be consistent with pneumonia.  There is lingular heterogenicity's airspace opacity suspicious for pneumonia.  Possible faint opacity within the right lower lung.  Also clinically based on patient's presentation very suggestive.  Patient's lactic acid is normal initial troponin 9 but this does not seem to be any concern for an acute cardiac event.  COVID testing for influenza testing negative.  CBC White blood cell count is 15.4 and platelets are 427 K.  All very reassuring.  Patient's hemoglobin is 12.6.  Complete metabolic panel no liver  function test abnormalities no lecture light abnormalities and patient's renal function is normal.  Patient has a delta troponin in process.  In the second lactic acid.  Patient's vital signs here are very reassuring.  Blood cultures were done and results pending.  Because of his HIV history will treat with IV Rocephin and IV Zithromax and have him continue Zithromax at home particularly to cover an atypical type infection.  And have him follow back up with his infectious disease specialist here at [facility].  Patient will return for any new or worse symptoms.  Patient without any evidence of hypoxia at this point in time.  Patient also has blood cultures that were done and pending.  Patient without any hypoxia here.  Current oxygen sats on room air 100%.  Patient's temp is up to 101.  Will give Tylenol.  Patient has not had any Tylenol today.  Patient clinically stable for discharge home.  Patient's repeat lactic acid was 1.2.  Initial lactic acid 1.1.  Patient's repeat troponin was 4 and initial troponin was 9.  All very reassuring patient with excellent oxygen saturations in the upper 90s to 100% on room air.  Patient received IV antibiotics Rocephin and azithromycin and will continue on azithromycin outpatient and will follow-up closely with infectious disease.   Final Clinical Impression(s) / ED Diagnoses Final diagnoses:  Community acquired pneumonia of left lung, unspecified part of lung  Community acquired pneumonia of right lower lobe of lung  HIV infection, unspecified symptom status ([place])    Rx / DC Orders ED Discharge Orders          Ordered    azithromycin (ZITHROMAX) 250 MG tablet  Daily        01/28/22 2309              Fredia Sorrow, MD 01/28/22 2310    Fredia Sorrow, MD 01/28/22 2328

## 2022-01-28 NOTE — ED Provider Triage Note (Signed)
Emergency Medicine Provider Triage Evaluation Note  EMAURI KRYGIER , a 31 y.o. male  was evaluated in triage.  Pt complains of cough, shortness of breath, been on for last 3 days, states he has a productive cough, states he feels some pain in his right chest specially after coughing, endorses fevers and chills no stomach pains nausea vomiting general body aches.  He is HIV positive is well controlled states his last levels were undetectable..  Review of Systems  Positive: Cough, chest pain Negative: chest pains, nausea vomiting  Physical Exam  BP 134/86 (BP Location: Right Arm)   Pulse 94   Temp 100.3 F (37.9 C)   Resp (!) 24   Ht 5\' 4"  (1.626 m)   Wt 71.2 kg   SpO2 93%   BMI 26.95 kg/m  Gen:   Awake, no distress   Resp:  Normal effort  MSK:   Moves extremities without difficulty  Other:    Medical Decision Making  Medically screening exam initiated at 5:30 PM.  Appropriate orders placed.  CHRISTO HAIN was informed that the remainder of the evaluation will be completed by another provider, this initial triage assessment does not replace that evaluation, and the importance of remaining in the ED until their evaluation is complete.  Lab work imaging ordered will need further work-up.   Marcello Fennel, PA-C 01/28/22 1731

## 2022-01-28 NOTE — ED Triage Notes (Addendum)
Pt reports productive cough of thick green mucous and pain to chest with inspiration; onset this morning when he woke up. Temp 100.3 at triage. HIV +

## 2022-01-28 NOTE — ED Notes (Signed)
Called for vitals and unable to locate in lobby.

## 2022-02-02 LAB — CULTURE, BLOOD (ROUTINE X 2)
Culture: NO GROWTH
Special Requests: ADEQUATE

## 2022-05-18 ENCOUNTER — Ambulatory Visit: Payer: Self-pay | Admitting: Internal Medicine

## 2022-06-23 ENCOUNTER — Telehealth: Payer: Self-pay

## 2022-06-23 NOTE — Telephone Encounter (Signed)
Detectable Viral Load Intervention   Most recent VL:  HIV 1 RNA Quant  Date Value Ref Range Status  10/05/2021 15,700 (H) Copies/mL Final  08/06/2020 14,200 (H) Copies/mL Final  02/25/2019 11,900 (H) NOT DETECT copies/mL Final    Current ART regimen: Triumeq  Appointment status: patient does not have future appointment scheduled   Called patient to discuss medication adherence and possible barriers to care.   Medication last dispensed (per chart review): 05/09/22   Interventions: Corinne Ports, first call could not be completed. Left HIPAA compliant voicemail requesting callback on second number listed in chart. Will attempt to reach out via MyChart.  Beryle Flock, RN

## 2022-07-12 ENCOUNTER — Ambulatory Visit: Payer: Self-pay | Admitting: Internal Medicine

## 2022-10-04 ENCOUNTER — Ambulatory Visit: Payer: Self-pay | Admitting: Pharmacist

## 2022-10-27 ENCOUNTER — Telehealth: Payer: Self-pay

## 2022-10-27 NOTE — Telephone Encounter (Signed)
Called patient after receiving refill request for Triumeq. Patient is past due for appointment. Left voicemail requesting patient call office to schedule appt. Will need to be seen in office to get refills. Juanita Laster, RMA

## 2023-04-04 NOTE — Progress Notes (Unsigned)
HPI: Jeremy Gallagher is a 32 y.o. male who presents to the [facility] pharmacy clinic for HIV follow-up.  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   AMPHETAMINE-DEXTROAMPHETAMINE (ADDERALL) 30 MG TABLET    Take 30 mg by mouth daily.   AZITHROMYCIN (ZITHROMAX) 250 MG TABLET    Take 1 tablet (250 mg total) by mouth daily. Take first 2 tablets together, then 1 every day until finished.   CLONAZEPAM (KLONOPIN) 1 MG TABLET    Take 1 mg by mouth daily.   DIPHENHYDRAMINE (BENADRYL) 25 MG CAPSULE    Take 1 capsule (25 mg total) by mouth every 6 (six) hours as needed for itching.   IBUPROFEN (ADVIL,MOTRIN) 200 MG TABLET    Take 800 mg by mouth every 6 (six) hours as needed for headache, mild pain or moderate pain. Reported on 07/14/2015   IVERMECTIN (STROMECTOL) 3 MG TABS TABLET    Take 4 tablets by mouth today.  Repeat 4 tablets by mouth in 1 week.   NAPROXEN (NAPROSYN) 500 MG TABLET    Take 1 tablet (500 mg total) by mouth 2 (two) times daily.  Modified Medications   No medications on file  Discontinued Medications   No medications on file    Labs: Lab Results  Component Value Date   HIV1RNAQUANT 15,700 (H) 10/05/2021   HIV1RNAQUANT 14,200 (H) 08/06/2020   HIV1RNAQUANT 11,900 (H) 02/25/2019   CD4TABS 213 (L) 10/05/2021   CD4TABS 266 (L) 08/06/2020   CD4TABS 464 02/25/2019    RPR and STI Lab Results  Component Value Date   LABRPR REACTIVE (A)  10/05/2021   LABRPR NON-REACTIVE 08/06/2020   LABRPR NON-REACTIVE 02/25/2019   LABRPR NON-REACTIVE 02/26/2018   LABRPR NON REAC 04/19/2016   RPRTITER 1:64 (H) 10/05/2021    STI Results GC CT  02/25/2019  9:28 AM Negative  Negative   07/08/2015 12:00 AM Negative  Negative     Hepatitis B Lab Results  Component Value Date   HEPBSAB INDETER (A) 07/07/2015   HEPBSAG NEGATIVE 07/07/2015   HEPBCAB NON REACTIVE 07/07/2015   Hepatitis C No results found for: "HEPCAB", "HCVRNAPCRQN" Hepatitis A Lab Results  Component Value Date   HAV NON REACTIVE 07/07/2015   Lipids: Lab Results  Component Value Date   CHOL 158 10/05/2021   TRIG 107 10/05/2021   HDL 47 10/05/2021   CHOLHDL 3.4 10/05/2021   VLDL 18 04/19/2016   LDLCALC 91 10/05/2021    Current HIV Regimen: Triumeq  Assessment: Jeremy Gallagher is here today to follow up for his HIV infection. He has missed multiple appointments and was last seen by Dr. Orvan Falconer on 10/05/2021. 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 His viral load at that appointment was 15,700. It seems as if he has struggled with adherence for years as he has not been undetectable since 2018. He was also treated for late latent syphilis back in 2023 with a  RPR of 1:64. He received 2 of his 3 weekly Bicillin doses.   He states that he has had housing and transportation issues since we saw him back in June 2023. He now lives with his uncle, has food stamps again, and has a car so things are improving. He said his mother has been on him about getting back on treatment so he is motivated to get back into care. He has not taken his Triumeq in over 2 months. He ran out of refills and we would not send any more until he followed up in clinic.   Discussed syphilis treatment with him. He is refusing to take 3 weekly Bicillin shots so will send in 28 days of doxycycline for him. Advised him to take twice daily with food to avoid nausea and to  take the entire course. His Jeremy Gallagher/HMAP is active so will send refills of Triumeq and his doxycycline to [facility]. He will go there after this appointment to pick them up. He declined flu and COVID vaccines today but will consider them at his next appointment. Dr. Orvan Falconer was his prior provider so will get him set up with a new one in ~1 month. Will update labs today including resistance testing. He refused STI testing other than a urine check.  Plan: - Refill Triumeq - Doxycycline 100 mg PO BID x 28 days - HIV RNA with genotype, CD4, CBC with diff, CMP, RPR, urine cytology, and lipid panel today - Follow up with Dr. Renold Gallagher for new patient appointment on 05/17/23  Jeremy Gallagher L. Jeremy Gallagher, PharmD, BCIDP, AAHIVP, CPP Clinical Pharmacist Practitioner Infectious Diseases Clinical Pharmacist [facility] 04/04/2023, 2:54 PM

## 2023-04-05 ENCOUNTER — Ambulatory Visit (INDEPENDENT_AMBULATORY_CARE_PROVIDER_SITE_OTHER): Payer: 59 | Admitting: Pharmacist

## 2023-04-05 ENCOUNTER — Other Ambulatory Visit: Payer: Self-pay

## 2023-04-05 ENCOUNTER — Other Ambulatory Visit (HOSPITAL_COMMUNITY)
Admission: RE | Admit: 2023-04-05 | Discharge: 2023-04-05 | Disposition: A | Discharge status: Home / Self Care | Payer: 59 | Source: Ambulatory Visit | Attending: Infectious Disease | Admitting: Infectious Disease

## 2023-04-05 DIAGNOSIS — Z0000 Encounter for general adult medical examination without abnormal findings: Secondary | ICD-10-CM

## 2023-04-05 DIAGNOSIS — B20 Human immunodeficiency virus [HIV] disease: Secondary | ICD-10-CM | POA: Diagnosis not present

## 2023-04-05 DIAGNOSIS — Z113 Encounter for screening for infections with a predominantly sexual mode of transmission: Secondary | ICD-10-CM | POA: Diagnosis present

## 2023-04-05 DIAGNOSIS — A529 Late syphilis, unspecified: Secondary | ICD-10-CM

## 2023-04-05 MED ORDER — DOXYCYCLINE HYCLATE 100 MG PO TABS: 100.0000 mg | ORAL_TABLET | Freq: Two times a day (BID) | ORAL | 0 refills | Status: AC

## 2023-04-05 MED ORDER — TRIUMEQ 600-50-300 MG PO TABS: 1.0000 | ORAL_TABLET | Freq: Every day | ORAL | 3 refills | Status: DC

## 2023-04-07 LAB — URINE CYTOLOGY ANCILLARY ONLY
Chlamydia: NEGATIVE
Comment: NEGATIVE
Comment: NORMAL
Neisseria Gonorrhea: NEGATIVE

## 2023-04-18 LAB — COMPREHENSIVE METABOLIC PANEL
AG Ratio: 1.2 (calc) (ref 1.0–2.5)
ALT: 9 U/L (ref 9–46)
AST: 15 U/L (ref 10–40)
Albumin: 4.3 g/dL (ref 3.6–5.1)
Alkaline phosphatase (APISO): 67 U/L (ref 36–130)
BUN: 14 mg/dL (ref 7–25)
CO2: 27 mmol/L (ref 20–32)
Calcium: 10 mg/dL (ref 8.6–10.3)
Chloride: 102 mmol/L (ref 98–110)
Creat: 0.9 mg/dL (ref 0.60–1.26)
Globulin: 3.5 g/dL (ref 1.9–3.7)
Glucose, Bld: 104 mg/dL — ABNORMAL HIGH (ref 65–99)
Potassium: 4 mmol/L (ref 3.5–5.3)
Sodium: 139 mmol/L (ref 135–146)
Total Bilirubin: 0.3 mg/dL (ref 0.2–1.2)
Total Protein: 7.8 g/dL (ref 6.1–8.1)

## 2023-04-18 LAB — CBC WITH DIFFERENTIAL/PLATELET
Absolute Lymphocytes: 2732 {cells}/uL (ref 850–3900)
Absolute Monocytes: 392 {cells}/uL (ref 200–950)
Basophils Absolute: 44 {cells}/uL (ref 0–200)
Basophils Relative: 0.5 %
Eosinophils Absolute: 122 {cells}/uL (ref 15–500)
Eosinophils Relative: 1.4 %
HCT: 44.7 % (ref 38.5–50.0)
Hemoglobin: 13.7 g/dL (ref 13.2–17.1)
MCH: 26.3 pg — ABNORMAL LOW (ref 27.0–33.0)
MCHC: 30.6 g/dL — ABNORMAL LOW (ref 32.0–36.0)
MCV: 85.8 fL (ref 80.0–100.0)
MPV: 9 fL (ref 7.5–12.5)
Monocytes Relative: 4.5 %
Neutro Abs: 5411 {cells}/uL (ref 1500–7800)
Neutrophils Relative %: 62.2 %
Platelets: 314 10*3/uL (ref 140–400)
RBC: 5.21 10*6/uL (ref 4.20–5.80)
RDW: 14.5 % (ref 11.0–15.0)
Total Lymphocyte: 31.4 %
WBC: 8.7 10*3/uL (ref 3.8–10.8)

## 2023-04-18 LAB — T PALLIDUM AB: T Pallidum Abs: POSITIVE — AB

## 2023-04-18 LAB — HIV-1 INTEGRASE GENOTYPE

## 2023-04-18 LAB — HEPATITIS C GENOTYPE: HCV Genotype: NOT DETECTED

## 2023-04-18 LAB — HIV-1 GENOTYPE: HIV-1 Genotype: DETECTED — AB

## 2023-04-18 LAB — RPR: RPR Ser Ql: REACTIVE — AB

## 2023-04-18 LAB — LIPID PANEL
Cholesterol: 131 mg/dL (ref <200)
HDL: 31 mg/dL — ABNORMAL LOW (ref >=40)
LDL Cholesterol (Calc): 73 mg/dL
Non-HDL Cholesterol (Calc): 100 mg/dL (ref <130)
Total CHOL/HDL Ratio: 4.2 (calc) (ref <5.0)
Triglycerides: 169 mg/dL — ABNORMAL HIGH (ref <150)

## 2023-04-18 LAB — HIV RNA, RTPCR W/R GT (RTI, PI,INT)
HIV 1 RNA Quant: 3360 {copies}/mL — ABNORMAL HIGH
HIV-1 RNA Quant, Log: 3.53 {Log} — ABNORMAL HIGH

## 2023-04-18 LAB — T-HELPER CELLS (CD4) COUNT (NOT AT ARMC)
Absolute CD4: 334 {cells}/uL — ABNORMAL LOW (ref 490–1740)
CD4 T Helper %: 12 % — ABNORMAL LOW (ref 30–61)
Total lymphocyte count: 2748 {cells}/uL (ref 850–3900)

## 2023-04-18 LAB — RPR TITER: RPR Titer: 1:4 {titer} — ABNORMAL HIGH

## 2023-05-15 ENCOUNTER — Ambulatory Visit: Payer: 59 | Admitting: Internal Medicine

## 2023-05-16 ENCOUNTER — Ambulatory Visit: Payer: 59 | Admitting: Internal Medicine

## 2023-05-17 ENCOUNTER — Ambulatory Visit: Payer: 59 | Admitting: Internal Medicine

## 2023-05-17 ENCOUNTER — Telehealth: Payer: Self-pay

## 2023-05-17 NOTE — Telephone Encounter (Signed)
Detectable Viral Load Intervention (DVL)  Most recent VL:  HIV 1 RNA Quant  Date Value Ref Range Status  04/05/2023 3,360 (H) copies/mL Final  10/05/2021 15,700 (H) Copies/mL Final  08/06/2020 14,200 (H) Copies/mL Final    Last Clinic Visit: 05/18/23 Current ART regimen: Triumeq  Appointment status: patient has future appointment scheduled  Medication last dispensed (per chart review):  Dispensed Days Supply Quantity Provider Pharmacy  TRIUMEQ  600-50-300 MG TABS 05/11/2023 30 30 tablet Kuppelweiser, Cassie L, RPH-CPP [facility]  TRIUMEQ TAB (ABC600/DTG50/3TC 300) 04/05/2023 30 30 each Kuppelweiser, Cassie L, RPH-CPP [facility]     Medication Adherence   What pharmacy do you use for your ART?  [facility]   Do you pick up your medication at the pharmacy or is it mailed to you? pick up at pharmacy  How often do you miss a dose your ART? never or almost never  Are you experiencing any side effects with your ART? no  Are you having any trouble remembering what medication(s) you are supposed to take or how you are supposed to take them? no  What helps you remember to take your medication(s)?   no Barriers to Care   Lack of transportation to medical appointments? no  2. Housing instability? no  3. If you are currently employed, are you having difficulty taking time off of work for medical appointments? no  4. Financial concerns (rent, utilities, etc.) no  5. Lack of consistent access to food? no  6. Trouble remembering and attending your appointments? no  7. Are you experiencing any other barriers that make it hard for you to come to appointments or take medication regularly? no   Interventions   Called patient to discuss medication adherence and possible barriers to care. Denies missing any doses since filling medication. Confirmed appt tomorrow.  Juanita Laster, RMA

## 2023-05-18 ENCOUNTER — Ambulatory Visit: Payer: 59 | Admitting: Internal Medicine

## 2023-05-25 ENCOUNTER — Ambulatory Visit: Payer: 59 | Admitting: Internal Medicine

## 2023-05-30 ENCOUNTER — Ambulatory Visit: Payer: 59 | Admitting: Internal Medicine

## 2023-06-30 ENCOUNTER — Ambulatory Visit (HOSPITAL_COMMUNITY)
Admission: EM | Admit: 2023-06-30 | Discharge: 2023-06-30 | Disposition: A | Discharge status: Home / Self Care | Attending: Family Medicine | Admitting: Family Medicine

## 2023-06-30 ENCOUNTER — Encounter (HOSPITAL_COMMUNITY): Payer: Self-pay | Admitting: *Deleted

## 2023-06-30 DIAGNOSIS — K122 Cellulitis and abscess of mouth: Secondary | ICD-10-CM

## 2023-06-30 MED ORDER — CLINDAMYCIN HCL 300 MG PO CAPS: 300.0000 mg | ORAL_CAPSULE | Freq: Three times a day (TID) | ORAL | 0 refills | Status: AC

## 2023-06-30 MED ORDER — NAPROXEN 500 MG PO TABS: 500.0000 mg | ORAL_TABLET | Freq: Two times a day (BID) | ORAL | 0 refills | Status: DC | PRN

## 2023-06-30 NOTE — Discharge Instructions (Signed)
--  Take clindamycin 300 mg-- 1 capsule 3 times daily for 7 days  Take naproxen 500 mg--1 tablet every 12 hours as needed for pain  Do not take ibuprofen with the naproxen.

## 2023-06-30 NOTE — ED Provider Notes (Signed)
 [facility]    CSN: [id number] Arrival date & time: 06/30/23  1523      History   Chief Complaint Chief Complaint  Patient presents with   Abscess    HPI Jeremy Gallagher is a 33 y.o. male.    Abscess Here for swelling on the roof of his mouth that he has noticed since yesterday morning.  He was starting to have some pain before that for a few days.  No fever or chills  He is allergic to amoxicillin which makes him throw up, morphine, and succinylcholine.  He is set to have dental extractions.  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 Authorizing Provider  abacavir-dolutegravir-lamiVUDine (TRIUMEQ) 600-50-300 MG tablet Take 1 tablet by mouth daily. 04/05/23  Yes Kuppelweiser, Cassie L, RPH-CPP  clindamycin (CLEOCIN) 300 MG capsule Take 1 capsule (300 mg total) by mouth 3 (three) times daily for 7 days. 06/30/23 07/07/23 Yes Zenia Resides, MD  naproxen (NAPROSYN) 500 MG tablet Take 1 tablet (500 mg total) by mouth 2 (two) times daily as needed (pain). 06/30/23  Yes Zenia Resides, MD  diphenhydrAMINE (BENADRYL) 25 mg capsule Take 1 capsule (25 mg total) by mouth every 6 (six) hours as needed for itching. Patient not taking: Reported on 10/05/2021 02/02/21   Cliffton Asters, MD  ivermectin (STROMECTOL) 3 MG TABS  tablet Take 4 tablets by mouth today.  Repeat 4 tablets by mouth in 1 week. Patient not taking: Reported on 10/05/2021 02/04/21   Cliffton Asters, MD    Family History Family History  Problem Relation Age of Onset   Hypertension Mother    Cancer Maternal Aunt    Diabetes Maternal Aunt    Hypertension Maternal Aunt     Social History Social History   Tobacco Use   Smoking status: Never   Smokeless tobacco: Never  Vaping Use   Vaping status: Never Used  Substance Use Topics   Alcohol use: Yes    Alcohol/week: 4.0 standard drinks of alcohol    Types: 4 Shots of liquor per week    Comment: drinking increased since HIV diagnosis    Drug use: No     Allergies   Morphine, Amoxicillin, and Succinylcholine   Review of Systems Review of Systems   Physical Exam Triage Vital Signs ED Triage Vitals  Encounter Vitals Group     BP 06/30/23 1709 126/75     Systolic BP Percentile --      Diastolic BP Percentile --      Pulse Rate 06/30/23 1709 (!) 106     Resp 06/30/23 1709 18     Temp 06/30/23 1709 98.2 F (36.8 C)     Temp Source 06/30/23 1709 Oral     SpO2 06/30/23 1709 95 %  Weight --      Height --      Head Circumference --      Peak Flow --      Pain Score 06/30/23 1708 10     Pain Loc --      Pain Education --      Exclude from Growth Chart --    No data found.  Updated Vital Signs BP 126/75 (BP Location: Right Arm)   Pulse (!) 106   Temp 98.2 F (36.8 C) (Oral)   Resp 18   SpO2 95%   Visual Acuity Right Eye Distance:   Left Eye Distance:   Bilateral Distance:    Right Eye Near:   Left Eye Near:    Bilateral Near:     Physical Exam Vitals reviewed.  Constitutional:      General: He is not in acute distress.    Appearance: He is not toxic-appearing.  HENT:     Mouth/Throat:     Mouth: Mucous membranes are moist.     Comments: There is an area of swelling on his left hard palate that is about 2 cm in diameter.  There is no fluctuance but it  is fairly soft.  It is tender Eyes:     Extraocular Movements: Extraocular movements intact.     Conjunctiva/sclera: Conjunctivae normal.     Pupils: Pupils are equal, round, and reactive to light.  Cardiovascular:     Rate and Rhythm: Normal rate and regular rhythm.     Heart sounds: No murmur heard. Pulmonary:     Effort: Pulmonary effort is normal. No respiratory distress.     Breath sounds: Normal breath sounds. No stridor. No wheezing.  Musculoskeletal:     Cervical back: Neck supple.  Lymphadenopathy:     Cervical: No cervical adenopathy.  Skin:    Coloration: Skin is not pale.  Neurological:     General: No focal deficit present.     Mental Status: He is alert and oriented to person, place, and time.  Psychiatric:        Behavior: Behavior normal.      UC Treatments / Results  Labs (all labs ordered are listed, but only abnormal results are displayed) Labs Reviewed - No data to display  EKG   Radiology No results found.  Procedures Procedures (including critical care time)  Medications Ordered in UC Medications - No data to display  Initial Impression / Assessment and Plan / UC Course  I have reviewed the triage vital signs and the nursing notes.  Pertinent labs & imaging results that were available during my care of the patient were reviewed by me and considered in my medical decision making (see chart for details).     Clindamycin is sent in for oral infection and naproxen is sent in for pain.  He had had ibuprofen about 3 hours ago so I could not give him a Toradol injection here.   Final Clinical Impressions(s) / UC Diagnoses   Final diagnoses:  Oral infection     Discharge Instructions      --Take clindamycin 300 mg-- 1 capsule 3 times daily for 7 days  Take naproxen 500 mg--1 tablet every 12 hours as needed for pain  Do not take ibuprofen with the naproxen.     ED Prescriptions     Medication Sig Dispense Auth. Provider    clindamycin (CLEOCIN) 300 MG capsule Take 1 capsule (300 mg total) by mouth 3 (three) times daily for  7 days. 21 capsule Zenia Resides, MD   naproxen (NAPROSYN) 500 MG tablet Take 1 tablet (500 mg total) by mouth 2 (two) times daily as needed (pain). 30 tablet Leanette Eutsler, Janace Aris, MD      PDMP not reviewed this encounter.   Zenia Resides, MD 06/30/23 [phone]

## 2023-06-30 NOTE — ED Triage Notes (Signed)
 Pt states he has a knot in the roof of his mouth X 3 days. He states he just went for a dental consult and he is having all teeth pulled soon. He has taken tylenol and IBU multiple times today.

## 2023-08-01 ENCOUNTER — Encounter: Payer: Self-pay | Admitting: Internal Medicine

## 2023-08-01 ENCOUNTER — Other Ambulatory Visit: Payer: Self-pay

## 2023-08-01 ENCOUNTER — Other Ambulatory Visit (HOSPITAL_COMMUNITY)
Admission: RE | Admit: 2023-08-01 | Discharge: 2023-08-01 | Disposition: A | Discharge status: Home / Self Care | Source: Ambulatory Visit | Attending: Internal Medicine | Admitting: Internal Medicine

## 2023-08-01 ENCOUNTER — Ambulatory Visit: Admitting: Internal Medicine

## 2023-08-01 VITALS — BP 124/83 | HR 100 | Temp 98.1°F | Ht 64.0 in | Wt 153.0 lb

## 2023-08-01 DIAGNOSIS — Z111 Encounter for screening for respiratory tuberculosis: Secondary | ICD-10-CM

## 2023-08-01 DIAGNOSIS — A528 Late syphilis, latent: Secondary | ICD-10-CM

## 2023-08-01 DIAGNOSIS — Z113 Encounter for screening for infections with a predominantly sexual mode of transmission: Secondary | ICD-10-CM

## 2023-08-01 DIAGNOSIS — B20 Human immunodeficiency virus [HIV] disease: Secondary | ICD-10-CM | POA: Diagnosis present

## 2023-08-01 DIAGNOSIS — Z1159 Encounter for screening for other viral diseases: Secondary | ICD-10-CM

## 2023-08-01 MED ORDER — BICTEGRAVIR-EMTRICITAB-TENOFOV 50-200-25 MG PO TABS: 1.0000 | ORAL_TABLET | Freq: Every day | ORAL | 11 refills | Status: AC

## 2023-08-01 MED ORDER — DOXYCYCLINE HYCLATE 100 MG PO TABS: 100.0000 mg | ORAL_TABLET | Freq: Two times a day (BID) | ORAL | 0 refills | Status: AC

## 2023-08-01 NOTE — Progress Notes (Signed)
 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   BUPRENORPHINE-NALOXONE (SUBOXONE) 8-2 MG SUBL SL TABLET    Place 1 tablet under the tongue 3 (three) times daily.   DIPHENHYDRAMINE (BENADRYL) 25 MG CAPSULE    Take 1 capsule (25 mg total) by mouth every 6 (six) hours as needed for itching.   IVERMECTIN (STROMECTOL) 3 MG TABS TABLET    Take 4 tablets by mouth today.  Repeat 4 tablets by mouth in 1 week.   NAPROXEN (NAPROSYN) 500 MG TABLET    Take 1 tablet (500 mg total) by mouth 2 (two) times daily as needed (pain).  Modified Medications   No medications on file  Discontinued Medications   No medications on file    Subjective: Jeremy Gallagher is in today with his mother, Jeremy Gallagher.  He has been off of his Triumeq for several months and was taking it sporadically before he quit.  He says that he was actively snorting heroin and has been addicted for many many years.  He is now 19 days sober and eager to get back on treatment.  He is on Suboxone and gabapentin.  He is in drug treatment with [facility].  He denies feeling anxious or depressed.  His itching stopped promptly after I treated him for scabies last year.  He is currently homeless but living with his grandmother in [place].  He does not have stable transportation.    08/01/23 [facility]; first visit with dr Aviyon Hocevar Reviewed chart Last seen [facility] 03/2023: "his HIV infection. He has missed  multiple appointments and was last seen by Dr. Orvan Falconer on 10/05/2021. 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 His viral load at that appointment was 15,700. It seems as if he has struggled with adherence for years as he has not been undetectable since 2018. He was also treated for late latent syphilis back in 2023 with a RPR of 1:64. He received 2 of his 3 weekly Bicillin doses.   He states that he has had housing and transportation issues since we saw him back in June 2023. He now lives with his uncle, has food stamps again, and has a car so things are improving. He said his mother has been on him about getting back on treatment so he is motivated to get back into care. He has not taken his Triumeq in over 2 months. He ran out of refills and we would not send any more until he followed up in clinic.   Discussed syphilis treatment with him. He is refusing to take 3 weekly Bicillin shots so will send in 28 days of doxycycline..."  Patient is here with his boyfriend today  He is on suboxone for opioid addiction  Social/rest of hx is via a/p He wants to switch to biktarvy today     Review of Systems: All other ros negative  Past Medical History:  Diagnosis Date   Allergy    Anxiety    Depression    HIV infection (HCC)     Social History   Tobacco Use   Smoking status: Never   Smokeless tobacco: Never  Vaping Use   Vaping status: Never Used  Substance Use Topics   Alcohol use: Yes    Alcohol/week: 4.0 standard drinks of alcohol    Types: 4 Shots of liquor per week    Comment: drinking increased since HIV diagnosis    Drug use: No    Family History  Problem Relation Age of Onset   Hypertension Mother    Cancer Maternal Aunt    Diabetes Maternal Aunt    Hypertension Maternal Aunt     Allergies  Allergen Reactions   Morphine Anaphylaxis   Amoxicillin Nausea And Vomiting    "I throw my guts up"   Succinylcholine Other (See  Comments)    [place] - risk for malignant hyperthermia.     Health Maintenance  Topic Date Due   Medicare Annual Wellness (AWV)  Never done   COVID-19 Vaccine (1) Never done   Pneumococcal Vaccine 80-61 Years old (1 of 2 - PCV) Never done   DTaP/Tdap/Td (10 - Td or Tdap) 10/20/2023   INFLUENZA VACCINE  11/17/2023   HPV VACCINES  Completed   Hepatitis C Screening  Completed   HIV Screening  Completed   Meningococcal B Vaccine  Aged Out    Objective:  Vitals:   08/01/23 1312  BP: 124/83  Pulse: 100  Temp: 98.1 F (36.7 C)  TempSrc: Oral  SpO2: 97%  Weight: 153 lb (69.4 kg)  Height: 5\' 4"  (1.626 m)   Body mass index is 26.26 kg/m.   Physical exam: General/constitutional: no distress, pleasant HEENT: Normocephalic, PER, Conj Clear, EOMI, Oropharynx clear Neck supple CV: rrr no mrg Lungs: clear to auscultation, normal respiratory effort Abd: Soft, Nontender Ext: no edema Skin: No Rash Neuro: nonfocal MSK: no peripheral joint swelling/tenderness/warmth; back spines nontender    Lab Results Lab Results  Component Value Date   WBC 8.7 04/05/2023   HGB 13.7 04/05/2023   HCT 44.7 04/05/2023   MCV 85.8 04/05/2023   PLT 314 04/05/2023    Lab Results  Component Value Date   CREATININE 0.90 04/05/2023   BUN 14 04/05/2023   NA 139 04/05/2023   K 4.0 04/05/2023   CL 102 04/05/2023   CO2 27 04/05/2023    Lab Results  Component Value Date   ALT 9 04/05/2023   AST 15 04/05/2023   ALKPHOS 81 01/28/2022   BILITOT 0.3 04/05/2023    Lab Results  Component Value Date   CHOL 131 04/05/2023   HDL 31 (L) 04/05/2023   LDLCALC 73 04/05/2023   TRIG 169 (H) 04/05/2023   CHOLHDL 4.2 04/05/2023   Lab Results  Component Value Date   LABRPR REACTIVE (A) 04/05/2023   RPRTITER 1:4 (H) 04/05/2023   HIV 1 RNA Quant  Date Value  04/05/2023 3,360 copies/mL (H)  10/05/2021 15,700 Copies/mL (H)  08/06/2020 14,200 Copies/mL (H)   CD4 T Cell Abs (/uL)  Date Value   10/05/2021 213 (L)  08/06/2020 266 (L)  02/25/2019 464     Problem List Items Addressed This Visit     HIV disease (HCC) - Primary   Relevant Medications   bictegravir-emtricitabine-tenofovir AF (BIKTARVY) 50-200-25 MG TABS tablet   Other Relevant Orders   CBC   COMPLETE METABOLIC PANEL  WITHOUT GFR   HIV 1 RNA quant-no reflex-bld   T-helper cells (CD4) count   Other Visit Diagnoses       Screening for STDs (sexually transmitted diseases)       Relevant Orders   Urine cytology ancillary only([place])   Cytology (oral, anal, urethral) ancillary only   Cytology (oral, anal, urethral) ancillary only     Screening-pulmonary TB       Relevant Orders   Hepatitis B core antibody, total   Hepatitis B surface antibody,quantitative   Hepatitis B Surface AntiGEN   QuantiFERON-TB Gold Plus     Need for hepatitis B screening test         Need for hepatitis C screening test       Relevant Orders   Hepatitis C Antibody     Late latent syphilis       Relevant Medications   bictegravir-emtricitabine-tenofovir AF (BIKTARVY) 50-200-25 MG TABS tablet   Other Relevant Orders   RPR      #social -born and raised in [place], [place]. -no prior travel outside of country -education level -- HS graduate -in a relationship with boyfriend Woodward Ku of 8 years as of 07/2023; partner hiv negative not on prep -lives in a house of his uncle's ownership; lives with boyfriend, mom and grandma -no etoh -tobacco -- 5 cig a day; since age 75 -electronic vaping; since 2024 -hx heroin (indu); smoked crack cocaine; no hx ivdu -not currently working as of 08/01/23 due to homelessness     #hiv Dx'ed 2016 incidental accidental needle stick (boyfriend at that time did have hiv) Risk -- msm Cd4 nadir -- 260 (8%) @ 06/2015 OI hx -- none Therapy hx -- 2016-c Triumeq  Haven't been undetectable since 2018 Reason for compliance --> homeless, ivdu, transportation. He has a car, a house; source of income  family assistance. He had ryan white for hiv assistance.   He has been back on triumeq for 04/2023; missing about 2 doses last 4 weeks  08/01/23 -- checked with Deanna, patient has both medicaid and medicare   -discussed u=u; advise protection (condom to prevent transmission or partner need to go on PrEP) -encourage compliance -continue current HIV medication -labs today -f/u in 3 months    #syphilis 03/2023 visited for 3rd bicillin shot late latent syphilis tx but didn't want -- given 28 days doxycycline but missing doses "don't know what I done with the pill" No rash, headache, vision changes.  Woodward Ku his boyfriend (together 8 years) was also dx'ed with syphilis around 07/2022 when he was incarcerated. He was treated with only 1 shots bicillin although he has no sx/rash and wasn't treated within the year prior  -patient wants a full 28 days doxycycline again and I think it's reasonable -advise that his boyfriend should also get the same or get 3 weekly shots bicillin -rpr testing along with triple screen today    #substance use/opioid dependence  -continue suboxone; he gets it from [facility]   #hcm -vaccination -hepatitis screening To check today 08/01/23 -std screening See above -metabolic Reprieve trial -- n/a -tb screening Check today 08/01/23 -cancer screening N/a    Raymondo Band, MD [facility] [phone] pager   [phone] cell 08/01/2023, 1:19 PM

## 2023-08-01 NOTE — Patient Instructions (Signed)
 Blood/urine/swab (rectal/oral) today   Biktarvy rx sent to [facility] [address]  You have medicaid and medicare insurance   Doxycycline take 1 tablet twice a day for 30 days ; 60 days given    See me in 3 months

## 2023-08-02 LAB — URINE CYTOLOGY ANCILLARY ONLY
Chlamydia: NEGATIVE
Comment: NEGATIVE
Comment: NEGATIVE
Comment: NORMAL
Neisseria Gonorrhea: NEGATIVE
Trichomonas: NEGATIVE

## 2023-08-02 LAB — CYTOLOGY, (ORAL, ANAL, URETHRAL) ANCILLARY ONLY
Chlamydia: NEGATIVE
Chlamydia: NEGATIVE
Comment: NEGATIVE
Comment: NEGATIVE
Comment: NORMAL
Comment: NORMAL
Neisseria Gonorrhea: NEGATIVE
Neisseria Gonorrhea: NEGATIVE

## 2023-08-02 LAB — T-HELPER CELLS (CD4) COUNT (NOT AT ARMC)
CD4 % Helper T Cell: 13 % — ABNORMAL LOW (ref 33–65)
CD4 T Cell Abs: 290 /uL — ABNORMAL LOW (ref 400–1790)

## 2023-08-03 ENCOUNTER — Encounter: Payer: Self-pay | Admitting: Internal Medicine

## 2023-08-03 LAB — COMPLETE METABOLIC PANEL WITHOUT GFR
AG Ratio: 1.7 (calc) (ref 1.0–2.5)
ALT: 10 U/L (ref 9–46)
AST: 20 U/L (ref 10–40)
Albumin: 4.9 g/dL (ref 3.6–5.1)
Alkaline phosphatase (APISO): 61 U/L (ref 36–130)
BUN: 13 mg/dL (ref 7–25)
CO2: 29 mmol/L (ref 20–32)
Calcium: 9.8 mg/dL (ref 8.6–10.3)
Chloride: 104 mmol/L (ref 98–110)
Creat: 1.07 mg/dL (ref 0.60–1.26)
Globulin: 2.9 g/dL (ref 1.9–3.7)
Glucose, Bld: 81 mg/dL (ref 65–99)
Potassium: 4 mmol/L (ref 3.5–5.3)
Sodium: 139 mmol/L (ref 135–146)
Total Bilirubin: 0.4 mg/dL (ref 0.2–1.2)
Total Protein: 7.8 g/dL (ref 6.1–8.1)

## 2023-08-03 LAB — CBC
HCT: 40.9 % (ref 38.5–50.0)
Hemoglobin: 13.4 g/dL (ref 13.2–17.1)
MCH: 27.9 pg (ref 27.0–33.0)
MCHC: 32.8 g/dL (ref 32.0–36.0)
MCV: 85 fL (ref 80.0–100.0)
MPV: 9.2 fL (ref 7.5–12.5)
Platelets: 285 10*3/uL (ref 140–400)
RBC: 4.81 10*6/uL (ref 4.20–5.80)
RDW: 15.2 % — ABNORMAL HIGH (ref 11.0–15.0)
WBC: 8 10*3/uL (ref 3.8–10.8)

## 2023-08-03 LAB — HEPATITIS B SURFACE ANTIBODY, QUANTITATIVE: Hep B S AB Quant (Post): >1000 m[IU]/mL (ref >=10)

## 2023-08-03 LAB — HEPATITIS B SURFACE ANTIGEN: Hepatitis B Surface Ag: NONREACTIVE

## 2023-08-03 LAB — HEPATITIS B CORE ANTIBODY, TOTAL: Hep B Core Total Ab: NONREACTIVE

## 2023-08-03 LAB — QUANTIFERON-TB GOLD PLUS
Mitogen-NIL: 8.05 [IU]/mL
NIL: 0.05 [IU]/mL
QuantiFERON-TB Gold Plus: NEGATIVE
TB1-NIL: <0 [IU]/mL
TB2-NIL: <0 [IU]/mL

## 2023-08-03 LAB — RPR: RPR Ser Ql: REACTIVE — AB

## 2023-08-03 LAB — HEPATITIS C ANTIBODY: Hepatitis C Ab: NONREACTIVE

## 2023-08-03 LAB — T PALLIDUM AB: T Pallidum Abs: POSITIVE — AB

## 2023-08-03 LAB — RPR TITER: RPR Titer: 1:8 {titer} — ABNORMAL HIGH

## 2023-08-03 LAB — HIV-1 RNA QUANT-NO REFLEX-BLD
HIV 1 RNA Quant: NOT DETECTED {copies}/mL
HIV-1 RNA Quant, Log: NOT DETECTED {Log_copies}/mL

## 2023-08-07 NOTE — Telephone Encounter (Signed)
-----   Message from [place] sent at 08/03/2023  5:29 PM EDT ----- Patient had asked for a full month of doxy for treatment of his late latent syphilis (he might have not finished tx previously or might have contracted again from his boyfriend)  Just fyi, nothing to do

## 2023-08-07 NOTE — Telephone Encounter (Signed)
 Left vm requesting pt log into mychart to review results or call office back with questions.  Julien Odor, RMA

## 2023-08-14 ENCOUNTER — Ambulatory Visit (INDEPENDENT_AMBULATORY_CARE_PROVIDER_SITE_OTHER)

## 2023-08-14 ENCOUNTER — Ambulatory Visit (HOSPITAL_COMMUNITY)
Admission: EM | Admit: 2023-08-14 | Discharge: 2023-08-14 | Disposition: A | Discharge status: Home / Self Care | Attending: Family Medicine | Admitting: Family Medicine

## 2023-08-14 ENCOUNTER — Encounter (HOSPITAL_COMMUNITY): Payer: Self-pay

## 2023-08-14 DIAGNOSIS — L03011 Cellulitis of right finger: Secondary | ICD-10-CM | POA: Diagnosis not present

## 2023-08-14 DIAGNOSIS — F419 Anxiety disorder, unspecified: Secondary | ICD-10-CM

## 2023-08-14 DIAGNOSIS — M79644 Pain in right finger(s): Secondary | ICD-10-CM

## 2023-08-14 DIAGNOSIS — L089 Local infection of the skin and subcutaneous tissue, unspecified: Secondary | ICD-10-CM | POA: Diagnosis not present

## 2023-08-14 DIAGNOSIS — R000 Tachycardia, unspecified: Secondary | ICD-10-CM | POA: Diagnosis not present

## 2023-08-14 MED ORDER — SULFAMETHOXAZOLE-TRIMETHOPRIM 800-160 MG PO TABS: 1.0000 | ORAL_TABLET | Freq: Two times a day (BID) | ORAL | 0 refills | Status: AC

## 2023-08-14 NOTE — ED Triage Notes (Signed)
 Patient c/o right index finger swelling and pain. Patient states he does not think he injured it.  Patient states he has been taking Tylenol  and ibuprofen  for his pain.

## 2023-08-14 NOTE — Discharge Instructions (Signed)
 We are treating you for an infection in your finger.  Start the antibiotics as prescribed.  Follow-up with hand specialist as soon as possible.  Call them to schedule an appointment.  If anything worsens you have increasing pain, swelling, numbness or tingling in the finger, fever you need to go to the emergency room immediately.  Your heart rate is elevated.  Please monitor this at home and if persistently above 100 bpm you need to be reevaluated.  If you have any headache, dizziness, chest pain, lightheadedness, heart racing, shortness of breath you need to be seen immediately.

## 2023-08-14 NOTE — ED Provider Notes (Signed)
 [facility]    CSN: [id number] Arrival date & time: 08/14/23  1802      History   Chief Complaint No chief complaint on file.   HPI Jeremy Gallagher is a 33 y.o. male.   Patient presents today with a 2-day history of swelling and pain of his right index finger.  He denies any known injury or wound before symptoms began.  He reports this has gradually become more painful and swollen prompting evaluation.  Denies any fever, nausea, vomiting.  He is left-handed.  He does have a history of HIV but reports that he is compliant with antiviral therapy and is undetectable.  Denies history of MRSA, diabetes, recurrent skin infections.  He was prescribed doxycycline  at his infectious disease clinic but has not been taking this regularly.  Denies additional antibiotics in the past 90 days.  Pain is rated 9 on a 0-10 pain scale, localized to DIP joint of right index finger, described as throbbing, no aggravating relieving factors identified.  He denies any personal or family history of rheumatoid arthritis.    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 Authorizing Provider  sulfamethoxazole -trimethoprim  (BACTRIM  DS) 800-160 MG tablet Take 1 tablet by mouth 2 (two) times daily for 7 days. 08/14/23 08/21/23 Yes Safwan Tomei K, PA-C   bictegravir-emtricitabine-tenofovir AF (BIKTARVY) 50-200-25 MG TABS tablet Take 1 tablet by mouth daily. 08/01/23   Vu, Garnette Ka T, MD  buprenorphine-naloxone (SUBOXONE) 8-2 mg SUBL SL tablet Place 1 tablet under the tongue 3 (three) times daily. 07/21/23   [provider]  diphenhydrAMINE  (BENADRYL ) 25 mg capsule Take 1 capsule (25 mg total) by mouth every 6 (six) hours as needed for itching. Patient not taking: Reported on 10/05/2021 02/02/21   Cory Dingwall, MD  doxycycline  (VIBRA -TABS) 100 MG tablet Take 1 tablet (100 mg total) by mouth 2 (two) times daily. 08/01/23 09/30/23  Vu, Martie Slaughter, MD  ivermectin  (STROMECTOL ) 3 MG TABS tablet Take 4 tablets by mouth today.  Repeat 4 tablets by mouth in 1 week. Patient not taking: Reported on 10/05/2021 02/04/21   Cory Dingwall, MD  naproxen  (NAPROSYN ) 500 MG tablet Take 1 tablet (500 mg total) by mouth 2 (two) times daily as needed (pain). Patient not taking: Reported on 08/01/2023 06/30/23   Ann Keto, MD    Family History Family History  Problem Relation Age of Onset   Hypertension Mother    Cancer Maternal Aunt    Diabetes Maternal Aunt    Hypertension Maternal Aunt     Social History Social History   Tobacco Use   Smoking status: Every Day    Current packs/day: 0.50    Types: Cigarettes   Smokeless tobacco: Never  Vaping Use   Vaping status: Every Day  Substances: Nicotine, Flavoring  Substance Use Topics   Alcohol use: Yes    Alcohol/week: 4.0 standard drinks of alcohol    Types: 4 Shots of liquor per week    Comment: drinking increased since HIV diagnosis    Drug use: No     Allergies   Morphine , Amoxicillin, and Succinylcholine   Review of Systems Review of Systems  Constitutional:  Positive for activity change. Negative for appetite change, fatigue and fever.  Respiratory:  Negative for shortness of breath.   Cardiovascular:  Negative for chest pain, palpitations and leg swelling.  Gastrointestinal:   Negative for abdominal pain, diarrhea, nausea and vomiting.  Musculoskeletal:  Positive for arthralgias. Negative for myalgias.  Skin:  Positive for color change. Negative for wound.  Neurological:  Negative for weakness and numbness.     Physical Exam Triage Vital Signs ED Triage Vitals [08/14/23 1928]  Encounter Vitals Group     BP 129/78     Systolic BP Percentile      Diastolic BP Percentile      Pulse Rate (!) 126     Resp 14     Temp 99.3 F (37.4 C)     Temp Source Oral     SpO2 94 %     Weight      Height      Head Circumference      Peak Flow      Pain Score 9     Pain Loc      Pain Education      Exclude from Growth Chart    No data found.  Updated Vital Signs BP 129/78 (BP Location: Left Arm)   Pulse (!) 123   Temp 99.3 F (37.4 C) (Oral)   Resp 14   SpO2 94%   Visual Acuity Right Eye Distance:   Left Eye Distance:   Bilateral Distance:    Right Eye Near:   Left Eye Near:    Bilateral Near:     Physical Exam Vitals reviewed.  Constitutional:      General: He is awake.     Appearance: Normal appearance. He is well-developed. He is not ill-appearing.     Comments: Very pleasant male appears stated age in no acute distress sitting comfortably in exam room  HENT:     Head: Normocephalic and atraumatic.     Mouth/Throat:     Dentition: Abnormal dentition.     Pharynx: Uvula midline. No oropharyngeal exudate, posterior oropharyngeal erythema or uvula swelling.  Cardiovascular:     Rate and Rhythm: Regular rhythm. Tachycardia present.     Heart sounds: Normal heart sounds, S1 normal and S2 normal. No murmur heard. Pulmonary:     Effort: Pulmonary effort is normal.     Breath sounds: Normal breath sounds. No stridor. No wheezing, rhonchi or rales.     Comments: Clear to auscultation bilaterally Musculoskeletal:     Right hand: Swelling and tenderness present. Normal sensation. There is no disruption of two-point discrimination. Normal capillary  refill.     Comments: Right index finger: Swelling noted particularly over DIP joint.  No wound, bleeding, drainage noted.  Callus noted over palmar DIP joint with increased tenderness palpation.  Decreased flexion of the finger secondary to swelling.  Finger is neurovascularly intact.  Neurological:     Mental Status: He is alert.  Psychiatric:        Behavior: Behavior is cooperative.      UC Treatments / Results  Labs (all  labs ordered are listed, but only abnormal results are displayed) Labs Reviewed - No data to display  EKG   Radiology DG Finger Index Right Result Date: 08/14/2023 CLINICAL DATA:  Right index finger pain and swelling EXAM: RIGHT INDEX FINGER 2+V COMPARISON:  None Available. FINDINGS: Frontal, oblique, and lateral views of the right second digit are obtained. No acute or destructive bony abnormalities. Joint spaces are well preserved. Alignment is anatomic. Marked soft tissue swelling, greatest along the dorsal aspect of the proximal interphalangeal joint. No subcutaneous gas or radiopaque foreign body. IMPRESSION: 1. Soft tissue swelling. No acute or destructive bony abnormalities. Electronically Signed   By: Bobbye Burrow M.D.   On: 08/14/2023 20:10    Procedures Procedures (including critical care time)  Medications Ordered in UC Medications - No data to display  Initial Impression / Assessment and Plan / UC Course  I have reviewed the triage vital signs and the nursing notes.  Pertinent labs & imaging results that were available during my care of the patient were reviewed by me and considered in my medical decision making (see chart for details).     Patient is tachycardic but otherwise well-appearing, afebrile, nontoxic.  X-ray was obtained that showed no evidence of osteomyelitis or acute osseous abnormality of finger.  Concern for infection given increasing pain and swelling.  Will cover for MRSA with Bactrim  DS.  Discussed that if he develops any rash  or lesions he is to stop the medication to be seen immediately.  No indication for dose adjustment based on metabolic panel from 08/01/2023 with creatinine of 1.07 and calculated creatinine clearance of 97.29 mL/min.  I do recommend that he follow closely with an specialist was given the contact information for local provider for instruction card schedule an appointment.  Encouraged him to be reevaluated within a few days to ensure that symptoms are improving with medication and that if anything worsens he should go to the emergency room.  Patient expressed understanding and agreement with treatment plan.  Patient was significantly tachycardic.  He reported anxiety and that he typically has an elevated heart rate when he is in a doctor's office.  I did request that we obtain an EKG and some lab work, however, he declined this as he believes it is situational.  Recommended that he monitor his heart rate at home and if this is persistently above 100 bpm he needs to be reevaluated for workup.  Recommend close follow-up with primary care; he does not currently have a PCP we will try to establish with someone in the near future.  Discussed that he would help with any symptoms including chest pain, shortness of breath, palpitations need to be seen immediately.  Strict return precautions given.    Final Clinical Impressions(s) / UC Diagnoses   Final diagnoses:  Finger infection  Pain in finger of right hand  Tachycardia  Anxiety     Discharge Instructions      We are treating you for an infection in your finger.  Start the antibiotics as prescribed.  Follow-up with hand specialist as soon as possible.  Call them to schedule an appointment.  If anything worsens you have increasing pain, swelling, numbness or tingling in the finger, fever you need to go to the emergency room immediately.  Your heart rate is elevated.  Please monitor this at home and if persistently above 100 bpm you need to be reevaluated.   If you have any headache, dizziness, chest pain, lightheadedness, heart racing,  shortness of breath you need to be seen immediately.     ED Prescriptions     Medication Sig Dispense Auth. Provider   sulfamethoxazole -trimethoprim  (BACTRIM  DS) 800-160 MG tablet Take 1 tablet by mouth 2 (two) times daily for 7 days. 14 tablet Tayton Decaire K, PA-C      PDMP not reviewed this encounter.   Budd Cargo, Kirby Peoples 08/14/23 2147

## 2023-08-17 ENCOUNTER — Other Ambulatory Visit: Payer: Self-pay

## 2023-08-17 ENCOUNTER — Emergency Department (HOSPITAL_COMMUNITY)
Admission: EM | Admit: 2023-08-17 | Discharge: 2023-08-17 | Disposition: A | Discharge status: Home / Self Care | Attending: Emergency Medicine | Admitting: Emergency Medicine

## 2023-08-17 DIAGNOSIS — L089 Local infection of the skin and subcutaneous tissue, unspecified: Secondary | ICD-10-CM | POA: Insufficient documentation

## 2023-08-17 DIAGNOSIS — Z21 Asymptomatic human immunodeficiency virus [HIV] infection status: Secondary | ICD-10-CM | POA: Insufficient documentation

## 2023-08-17 DIAGNOSIS — M7989 Other specified soft tissue disorders: Secondary | ICD-10-CM | POA: Diagnosis present

## 2023-08-17 LAB — CBC WITH DIFFERENTIAL/PLATELET
Abs Immature Granulocytes: 0.04 10*3/uL (ref 0.00–0.07)
Basophils Absolute: 0 10*3/uL (ref 0.0–0.1)
Basophils Relative: 0 %
Eosinophils Absolute: 0 10*3/uL (ref 0.0–0.5)
Eosinophils Relative: 0 %
HCT: 39.8 % (ref 39.0–52.0)
Hemoglobin: 13.2 g/dL (ref 13.0–17.0)
Immature Granulocytes: 0 %
Lymphocytes Relative: 18 %
Lymphs Abs: 1.9 10*3/uL (ref 0.7–4.0)
MCH: 27.8 pg (ref 26.0–34.0)
MCHC: 33.2 g/dL (ref 30.0–36.0)
MCV: 83.8 fL (ref 80.0–100.0)
Monocytes Absolute: 0.6 10*3/uL (ref 0.1–1.0)
Monocytes Relative: 6 %
Neutro Abs: 7.9 10*3/uL — ABNORMAL HIGH (ref 1.7–7.7)
Neutrophils Relative %: 76 %
Platelets: 266 10*3/uL (ref 150–400)
RBC: 4.75 MIL/uL (ref 4.22–5.81)
RDW: 15.3 % (ref 11.5–15.5)
WBC: 10.6 10*3/uL — ABNORMAL HIGH (ref 4.0–10.5)
nRBC: 0 % (ref 0.0–0.2)

## 2023-08-17 LAB — COMPREHENSIVE METABOLIC PANEL WITH GFR
ALT: 12 U/L (ref 0–44)
AST: 21 U/L (ref 15–41)
Albumin: 4.6 g/dL (ref 3.5–5.0)
Alkaline Phosphatase: 61 U/L (ref 38–126)
Anion gap: 12 (ref 5–15)
BUN: 12 mg/dL (ref 6–20)
CO2: 24 mmol/L (ref 22–32)
Calcium: 9.4 mg/dL (ref 8.9–10.3)
Chloride: 98 mmol/L (ref 98–111)
Creatinine, Ser: 1.06 mg/dL (ref 0.61–1.24)
GFR, Estimated: >60 mL/min (ref >60)
Glucose, Bld: 108 mg/dL — ABNORMAL HIGH (ref 70–99)
Potassium: 3.1 mmol/L — ABNORMAL LOW (ref 3.5–5.1)
Sodium: 134 mmol/L — ABNORMAL LOW (ref 135–145)
Total Bilirubin: 0.6 mg/dL (ref 0.0–1.2)
Total Protein: 8.3 g/dL — ABNORMAL HIGH (ref 6.5–8.1)

## 2023-08-17 MED ORDER — LIDOCAINE HCL (PF) 1 % IJ SOLN
5.0000 mL | Freq: Once | INTRAMUSCULAR | Status: AC
  Administered 2023-08-17: 5 mL
  Filled 2023-08-17: qty 30

## 2023-08-17 MED ORDER — OXYCODONE-ACETAMINOPHEN 5-325 MG PO TABS
1.0000 | ORAL_TABLET | Freq: Once | ORAL | Status: AC
  Administered 2023-08-17: 1 via ORAL
  Filled 2023-08-17: qty 1

## 2023-08-17 MED ORDER — BACITRACIN ZINC 500 UNIT/GM EX OINT
TOPICAL_OINTMENT | CUTANEOUS | Status: AC
Start: 2023-08-17 — End: 2023-08-17
  Administered 2023-08-17: 1
  Filled 2023-08-17: qty 1.8

## 2023-08-17 NOTE — ED Notes (Signed)
 AVS provided by edp was reviewed with pt. Pt verbalized understanding with no additional questions at this time. Pt to follow up with hand surgery.

## 2023-08-17 NOTE — ED Triage Notes (Signed)
 Pt presents to ED with right index finger swelling that had been ongoing for 4 days. Was seen at urgent care 4/28 was prescribed abx. Sts he did not get prescription filled as he was already taking an abx for dx of syphilis. Currently on his second day of doxycyline. Report concern for increased swelling and decreased sensation. HR 120s in triage.

## 2023-08-17 NOTE — Discharge Instructions (Signed)
 You were seen today for an infection of the index finger of your right hand.  You need to follow-up with hand surgery for wound check in 3 to 5 days.  Continue your doxycycline  as prescribed.  Continue warm soaks at home.  If you note any new or worsening symptoms, you should be reevaluated immediately.

## 2023-08-17 NOTE — ED Notes (Signed)
 Pt left to bathroom after AVS overview - pt not found in room for discharge vs

## 2023-08-17 NOTE — ED Provider Notes (Signed)
 [place] EMERGENCY DEPARTMENT AT [facility] Provider Note   CSN: [id number] Arrival date & time: 08/17/23  0058     History  Chief Complaint  Patient presents with   Finger Swelling    Jeremy Gallagher is a 33 y.o. male.  HPI     This is a 33 year old male with history of HIV who presents with pain and swelling of the right index finger.  Reports 1 week history of progressive pain and swelling to the right index finger.  Denies any injury.  He does bite his fingernails and per his family he "picks" at his skin.  Denies IV drug use.  Has not had any fevers.  Tmax 99.  Patient is left-handed.  Of note, patient did go to urgent care 2 days ago and was given a prescription for Bactrim .  He did not start this antibiotic because he started doxycycline  for syphilis.  Pharmacist indicated that he did not need both.  He has currently had 2 days of doxycycline .  Home Medications Prior to Admission medications   Medication Sig Start Date End Date Taking? Authorizing Provider  bictegravir-emtricitabine-tenofovir AF (BIKTARVY) 50-200-25 MG TABS tablet Take 1 tablet by mouth daily. 08/01/23   Vu, Garnette Ka T, MD  buprenorphine-naloxone (SUBOXONE) 8-2 mg SUBL SL tablet Place 1 tablet under the tongue 3 (three) times daily. 07/21/23   [provider]  diphenhydrAMINE  (BENADRYL ) 25 mg capsule Take 1 capsule (25 mg total) by mouth every 6 (six) hours as needed for itching. Patient not taking: Reported on 10/05/2021 02/02/21   Cory Dingwall, MD  doxycycline  (VIBRA -TABS) 100 MG tablet Take 1 tablet (100 mg total) by mouth 2 (two) times daily. 08/01/23 09/30/23  Vu, Garnette Ka T, MD  ivermectin  (STROMECTOL ) 3 MG TABS tablet Take 4 tablets by mouth today.  Repeat 4 tablets by mouth in 1 week. Patient not taking: Reported on 10/05/2021 02/04/21   Cory Dingwall, MD  naproxen  (NAPROSYN ) 500 MG tablet Take 1 tablet (500 mg total) by mouth 2 (two) times daily as needed (pain). Patient not taking:  Reported on 08/01/2023 06/30/23   Ann Keto, MD  sulfamethoxazole -trimethoprim  (BACTRIM  DS) 800-160 MG tablet Take 1 tablet by mouth 2 (two) times daily for 7 days. 08/14/23 08/21/23  Raspet, Erin K, PA-C      Allergies    Morphine , Amoxicillin, and Succinylcholine    Review of Systems   Review of Systems  Constitutional:  Negative for fever.  Musculoskeletal:        Finger pain, joint swelling  All other systems reviewed and are negative.   Physical Exam Updated Vital Signs BP 129/78   Pulse (!) 105   Temp 98.1 F (36.7 C)   Resp 16   SpO2 99%  Physical Exam Vitals and nursing note reviewed.  Constitutional:      Appearance: He is well-developed. He is not ill-appearing.  HENT:     Head: Normocephalic and atraumatic.  Eyes:     Pupils: Pupils are equal, round, and reactive to light.  Cardiovascular:     Rate and Rhythm: Normal rate and regular rhythm.  Pulmonary:     Effort: Pulmonary effort is normal. No respiratory distress.  Abdominal:     Palpations: Abdomen is soft.  Musculoskeletal:     Cervical back: Neck supple.     Comments: Focused examination of the right second digit with swelling fluctuance and induration over the pad of the digit extending proximal to the PIP joint, there is  minimal flexion secondary to pain and swelling, no spontaneous drainage  Lymphadenopathy:     Cervical: No cervical adenopathy.  Skin:    General: Skin is warm and dry.  Neurological:     Mental Status: He is alert and oriented to person, place, and time.     ED Results / Procedures / Treatments   Labs (all labs ordered are listed, but only abnormal results are displayed) Labs Reviewed  COMPREHENSIVE METABOLIC PANEL WITH GFR - Abnormal; Notable for the following components:      Result Value   Sodium 134 (*)    Potassium 3.1 (*)    Glucose, Bld 108 (*)    Total Protein 8.3 (*)    All other components within normal limits  CBC WITH DIFFERENTIAL/PLATELET - Abnormal;  Notable for the following components:   WBC 10.6 (*)    Neutro Abs 7.9 (*)    All other components within normal limits    EKG None  Radiology No results found.  Procedures .Incision and Drainage  Date/Time: 08/17/2023 6:05 AM  Performed by: Rory Collard, MD Authorized by: Rory Collard, MD   Consent:    Consent obtained:  Verbal   Consent given by:  Patient   Risks discussed:  Bleeding, incomplete drainage and pain   Alternatives discussed:  No treatment Universal protocol:    Patient identity confirmed:  Verbally with patient Location:    Type:  Abscess   Location:  Upper extremity   Upper extremity location:  Finger   Finger location:  R index finger Pre-procedure details:    Skin preparation:  Chlorhexidine and povidone-iodine Sedation:    Sedation type:  None Anesthesia:    Anesthesia method:  Nerve block   Block location:  Ring block   Block needle gauge:  27 G   Block anesthetic:  Lidocaine  2% w/o epi   Block technique:  Ring block   Block injection procedure:  Anatomic landmarks identified and introduced needle   Block outcome:  Anesthesia achieved Procedure type:    Complexity:  Complex Procedure details:    Ultrasound guidance: no     Needle aspiration: no     Incision types:  Stab incision (2 stab incisions on the ulnar aspect of the digit)   Incision depth:  Dermal   Wound management:  Probed and deloculated   Drainage:  Purulent   Drainage amount:  Moderate   Wound treatment:  Wound left open   Packing materials:  None Post-procedure details:    Procedure completion:  Tolerated     Medications Ordered in ED Medications  oxyCODONE -acetaminophen  (PERCOCET/ROXICET) 5-325 MG per tablet 1 tablet (1 tablet Oral Given 08/17/23 0407)  lidocaine  (PF) (XYLOCAINE ) 1 % injection 5 mL (5 mLs Infiltration Given 08/17/23 0407)    ED Course/ Medical Decision Making/ A&P                                 Medical Decision Making Amount and/or  Complexity of Data Reviewed Labs: ordered.  Risk Prescription drug management.   This patient presents to the ED for concern of finger infection, this involves an extensive number of treatment options, and is a complaint that carries with it a high risk of complications and morbidity.  I considered the following differential and admission for this acute, potentially life threatening condition.  The differential diagnosis includes felon, deep space infection  MDM:    This  is a 33 year old male who presents with concerns for finger infection.  He is nontoxic.  Vital signs are reassuring.  He is afebrile.  He is anxious appearing.  History of HIV and currently being treated for syphilis.  However, the finger appears to be acutely infected with purulence.  It is a bigger lesion than I would expect for a felon and crosses the distal interphalangeal joint.  For this reason, hand surgery was consulted.  Discussed with PA who showed photo to Dr. Delmar Ferrara.  Recommends bedside I&D.  This was performed with expression of purulent material.  Patient is currently on doxycycline  which is appropriate.  Recommend warm soaks twice daily and keeping the wound dressed and clean.  Will follow-up with Dr. Delmar Ferrara.  (Labs, imaging, consults)  Labs: I Ordered, and personally interpreted labs.  The pertinent results include: CBC, CMP  Imaging Studies ordered: I ordered imaging studies including x-ray I independently visualized and interpreted imaging. I agree with the radiologist interpretation  Additional history obtained from chart review.  External records from outside source obtained and reviewed including prior evaluations  Cardiac Monitoring: The patient was not maintained on a cardiac monitor.  If on the cardiac monitor, I personally viewed and interpreted the cardiac monitored which showed an underlying rhythm of: N/A  Reevaluation: After the interventions noted above, I reevaluated the patient and found  that they have :improved  Social Determinants of Health:  lives independently  Disposition: Discharge  Co morbidities that complicate the patient evaluation  Past Medical History:  Diagnosis Date   Allergy    Anxiety    Depression    HIV infection (HCC)      Medicines Meds ordered this encounter  Medications   oxyCODONE -acetaminophen  (PERCOCET/ROXICET) 5-325 MG per tablet 1 tablet    Refill:  0   lidocaine  (PF) (XYLOCAINE ) 1 % injection 5 mL    I have reviewed the patients home medicines and have made adjustments as needed  Problem List / ED Course: Problem List Items Addressed This Visit   None Visit Diagnoses       Finger infection    -  Primary                   Final Clinical Impression(s) / ED Diagnoses Final diagnoses:  Finger infection    Rx / DC Orders ED Discharge Orders     None         Rory Collard, MD 08/17/23 [phone]

## 2023-10-24 ENCOUNTER — Other Ambulatory Visit: Payer: Self-pay

## 2023-10-24 ENCOUNTER — Encounter: Payer: Self-pay | Admitting: Internal Medicine

## 2023-10-24 ENCOUNTER — Ambulatory Visit: Admitting: Internal Medicine

## 2023-10-24 VITALS — BP 124/84 | HR 102 | Temp 97.8°F | Ht 64.0 in | Wt 153.0 lb

## 2023-10-24 DIAGNOSIS — B20 Human immunodeficiency virus [HIV] disease: Secondary | ICD-10-CM | POA: Diagnosis present

## 2023-10-24 DIAGNOSIS — A539 Syphilis, unspecified: Secondary | ICD-10-CM

## 2023-10-24 DIAGNOSIS — Z113 Encounter for screening for infections with a predominantly sexual mode of transmission: Secondary | ICD-10-CM

## 2023-10-24 NOTE — Progress Notes (Signed)
 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   BUPRENORPHINE-NALOXONE (SUBOXONE) 8-2 MG SUBL SL TABLET    Place 1 tablet under the tongue 3 (three) times daily.   DIPHENHYDRAMINE  (BENADRYL ) 25 MG CAPSULE    Take 1 capsule (25 mg total) by mouth every 6 (six) hours as needed for itching.   IVERMECTIN  (STROMECTOL ) 3 MG TABS TABLET    Take 4 tablets by mouth today.  Repeat 4 tablets by mouth in 1 week.   NAPROXEN  (NAPROSYN ) 500 MG TABLET    Take 1 tablet (500 mg total) by mouth 2 (two) times daily as needed (pain).  Modified Medications   No medications on file  Discontinued Medications   No medications on file    Subjective: Jeremy Gallagher is in today with his mother, Jeremy Gallagher.  He has been off of his Triumeq  for several months and was taking it sporadically before he quit.  He says that he was actively snorting heroin and has been addicted for many many years.  He is now 19 days sober and eager to get back on treatment.  He is on Suboxone and gabapentin.  He is in drug treatment with [facility].  He denies feeling anxious or depressed.  His itching stopped promptly after I treated him for scabies last year.  He is currently homeless but living with his grandmother in [place].  He does not have stable transportation.    08/01/23 [facility] clinic; first visit with dr Kaeden Depaz Reviewed chart Last seen [facility] pharmacy 03/2023: his HIV infection. He has  missed multiple appointments and was last seen by Dr. Elaine on 10/05/2021. He was off of his Triumeq  at that visit and was 19 days sober from heroin abuse. His viral load at that appointment was 15,700. It seems as if he has struggled with adherence for years as he has not been undetectable since 2018. He was also treated for late latent syphilis back in 2023 with a RPR of 1:64. He received 2 of his 3 weekly Bicillin  doses.   He states that he has had housing and transportation issues since we saw him back in June 2023. He now lives with his uncle, has food stamps again, and has a car so things are improving. He said his mother has been on him about getting back on treatment so he is motivated to get back into care. He has not taken his Triumeq  in over 2 months. He ran out of refills and we would not send any more until he followed up in clinic.   Discussed syphilis treatment with him. He is refusing to take 3 weekly Bicillin  shots so will send in 28 days of doxycycline ...  Patient is here with his boyfriend today  He is on suboxone for opioid addiction  Social/rest of hx is via a/p He wants to switch to biktarvy today   ------------ 10/24/23 id clinic f/u Boyfriend currently in voluntary hold/rehab;  his syphilis is treated there No other sexual exposure Taking biktarvy since 08/01/23 No concern/complaint today     Review of Systems: All other ros negative    Past Medical History:  Diagnosis Date   Allergy    Anxiety    Depression    HIV infection (HCC)     Social History   Tobacco Use   Smoking status: Every Day    Current packs/day: 0.50    Types: Cigarettes   Smokeless tobacco: Never  Vaping Use   Vaping status: Every Day   Substances: Nicotine, Flavoring  Substance Use Topics   Alcohol use: Yes    Alcohol/week: 4.0 standard drinks of alcohol    Types: 4 Shots of liquor per week    Comment: drinking increased since HIV diagnosis    Drug use: No    Family  History  Problem Relation Age of Onset   Hypertension Mother    Cancer Maternal Aunt    Diabetes Maternal Aunt    Hypertension Maternal Aunt     Allergies  Allergen Reactions   Morphine  Anaphylaxis   Amoxicillin Nausea And Vomiting    I throw my guts up   Succinylcholine Other (See Comments)    [place] - risk for malignant hyperthermia.     Health Maintenance  Topic Date Due   COVID-19 Vaccine (1) Never done   Pneumococcal Vaccine 45-43 Years old (1 of 2 - PCV) Never done   DTaP/Tdap/Td (10 - Td or Tdap) 10/20/2023   INFLUENZA VACCINE  11/17/2023   Hepatitis B Vaccines  Completed   HPV VACCINES  Completed   Hepatitis C Screening  Completed   HIV Screening  Completed   Meningococcal B Vaccine  Aged Out    Objective:  Vitals:   10/24/23 1546  BP: 124/84  Pulse: (!) 102  Temp: 97.8 F (36.6 C)  TempSrc: Temporal  SpO2: 97%  Weight: 153 lb (69.4 kg)  Height: 5' 4 (1.626 m)    Body mass index is 26.26 kg/m.   Physical exam: General/constitutional: no distress, pleasant HEENT: Normocephalic, PER, Conj Clear, EOMI, Oropharynx clear Neck supple CV: rrr no mrg Lungs: clear to auscultation, normal respiratory effort Abd: Soft, Nontender Ext: no edema Skin: No Rash Neuro: nonfocal MSK: no peripheral joint swelling/tenderness/warmth; back spines nontender    Lab Results Lab Results  Component Value Date   WBC 10.6 (H) 08/17/2023   HGB 13.2 08/17/2023   HCT 39.8 08/17/2023   MCV 83.8 08/17/2023   PLT 266 08/17/2023    Lab Results  Component Value Date   CREATININE 1.06 08/17/2023   BUN 12 08/17/2023   NA 134 (L) 08/17/2023   K 3.1 (L) 08/17/2023   CL 98 08/17/2023   CO2 24 08/17/2023    Lab Results  Component Value Date   ALT 12 08/17/2023   AST 21 08/17/2023   ALKPHOS 61 08/17/2023   BILITOT 0.6 08/17/2023    Lab Results  Component Value Date   CHOL 131 04/05/2023   HDL 31 (L) 04/05/2023   LDLCALC 73 04/05/2023   TRIG 169 (H)  04/05/2023   CHOLHDL 4.2 04/05/2023   Lab Results  Component Value Date   LABRPR REACTIVE (A) 08/01/2023   RPRTITER 1:8 (H) 08/01/2023   HIV 1 RNA Quant  Date Value  08/01/2023 NOT DETECTED copies/mL  04/05/2023 3,360 copies/mL (H)  10/05/2021 15,700 Copies/mL (H)   CD4 T Cell Abs (/uL)  Date Value  08/01/2023 290 (L)  10/05/2021  213 (L)  08/06/2020 266 (L)     Problem List Items Addressed This Visit   None   #social -born and raised in [place], [place]. -no prior travel outside of country -education level -- HS graduate -in a relationship with boyfriend Jeremy Gallagher of 8 years as of 07/2023; partner hiv negative not on prep -lives in a house of his uncle's ownership; lives with boyfriend, mom and grandma -no etoh -tobacco -- 5 cig a day; since age 69 -electronic vaping; since 2024 -hx heroin (indu); smoked crack cocaine; no hx ivdu -not currently working as of 08/01/23 due to homelessness     #hiv Dx'ed 2016 incidental accidental needle stick (boyfriend at that time did have hiv) Risk -- msm Cd4 nadir -- 260 (8%) @ 06/2015 OI hx -- none Therapy hx -- 2016-c Triumeq   Haven't been undetectable since 2018 Reason for compliance --> homeless, ivdu, transportation. He has a car, a house; source of income family assistance. He had ryan white for hiv assistance.   He has been back on triumeq  for 04/2023; missing about 2 doses last 4 weeks  08/01/23 -- checked with Deanna, patient has both medicaid and medicare. Transitioned to biktarvy  10/24/23 no missed dose of biktarvy last 4 weeks. Wants to continue current medication  -discussed u=u; advise protection (condom to prevent transmission or partner need to go on PrEP) -encourage compliance -continue current HIV medication biktarvy -labs today -f/u in 1 yr    #syphilis 03/2023 visited for 3rd bicillin  shot late latent syphilis tx but didn't want -- given 28 days doxycycline  but missing doses don't know what I done with  the pill No rash, headache, vision changes.  Jeremy Gallagher his boyfriend (together 8 years) was also dx'ed with syphilis around 07/2022 when he was incarcerated. He was treated with only 1 shots bicillin  although he has no sx/rash and wasn't treated within the year prior 08/01/23 id visit given 28 more days doxycycline  as he lost his previous prescription. Had advised boyfriend to check for syphilis as well  Rpr titer 08/01/23      8    03/2023     4 09/2021     65  -titer today; same relationship with his boyfriend who currently is in voluntary hold for ?substance -- said syphilis is being treated there   #substance use/opioid dependence  -continue suboxone; he gets it from [facility]   #hcm -vaccination Hpv by 07/2016 Tdap 2015 10/24/23 will give prevnar 20 and tetanus booster (only have tdap @ [facility]) and meningococcal booster -hepatitis screening 08/01/23 hep b sAb >1000; sAg and cAb nonreactive 08/01/23 hep c Ab nonreactive -std screening Hx syphilis started tx 03/2023 (see above) 08/01/23 triple screen gc/chlam negative -metabolic Reprieve trial -- n/a -tb screening 08/01/23 quantiferon gold negative -cancer screening N/a    Jeremy ONEIDA Passer, MD [facility]ious [facility] [phone] pager   [phone] cell 10/24/2023, 3:43 PM

## 2023-10-24 NOTE — Progress Notes (Signed)
 Addendum by dr Overton  Patient refused vaccines today after agreeing to [facility], tdap, and prevnar 20. Not given

## 2023-10-24 NOTE — Patient Instructions (Addendum)
 Vaccine today: Pneumonia (prevnar 20) Tetanus booster (tdap) Meningitis booster (menveo)   Continue your current hiv medication biktarvy    See me in 1 year for hiv follow up. Sooner for anything else as needed

## 2023-10-25 ENCOUNTER — Ambulatory Visit: Admitting: Internal Medicine

## 2023-10-25 ENCOUNTER — Ambulatory Visit

## 2023-10-26 LAB — BASIC METABOLIC PANEL WITH GFR
BUN: 15 mg/dL (ref 7–25)
CO2: 29 mmol/L (ref 20–32)
Calcium: 10.1 mg/dL (ref 8.6–10.3)
Chloride: 103 mmol/L (ref 98–110)
Creat: 1.01 mg/dL (ref 0.60–1.26)
Glucose, Bld: 76 mg/dL (ref 65–99)
Potassium: 4.2 mmol/L (ref 3.5–5.3)
Sodium: 140 mmol/L (ref 135–146)
eGFR: 101 mL/min/1.73m2 (ref >=60)

## 2023-10-26 LAB — RPR: RPR Ser Ql: REACTIVE — AB

## 2023-10-26 LAB — HIV-1 RNA QUANT-NO REFLEX-BLD
HIV 1 RNA Quant: 92 {copies}/mL — ABNORMAL HIGH
HIV-1 RNA Quant, Log: 1.96 {Log_copies}/mL — ABNORMAL HIGH

## 2023-10-26 LAB — T PALLIDUM AB: T Pallidum Abs: POSITIVE — AB

## 2023-10-26 LAB — RPR TITER: RPR Titer: 1:8 {titer} — ABNORMAL HIGH

## 2023-11-03 ENCOUNTER — Encounter (HOSPITAL_BASED_OUTPATIENT_CLINIC_OR_DEPARTMENT_OTHER): Payer: Self-pay | Admitting: Oral Surgery

## 2023-11-03 ENCOUNTER — Other Ambulatory Visit: Payer: Self-pay

## 2023-11-03 NOTE — Progress Notes (Signed)
   11/03/23 1232  PAT Phone Screen  Is the patient taking a GLP-1 receptor agonist? No  Do You Have Diabetes? No  Do You Have Hypertension? No  Have You Ever Been to the ER for Asthma? No  Have You Taken Oral Steroids in the Past 3 Months? No  Do you Take Phenteramine or any Other Diet Drugs? No  Recent  Lab Work, EKG, CXR? Yes  Where was this test performed? 7/8 [facility] labs  Do you have a history of heart problems? No  Any Recent Hospitalizations? No  Height 5' 4 (1.626 m)  Weight 69.4 kg  Pat Appointment Scheduled No  Reason for No Appointment Not Needed   Pt's history, recent office visit w/ ID and history of [place] heritage reviewed with Dr Maryclare. May proceed with surgery at [facility], OR start time reviewed and no change needed.

## 2023-11-06 NOTE — H&P (Signed)
  Patient: Jeremy Gallagher  PID: [id number]  DOB: [birth year]  SEX: Male   Patient referred by  DDS for extraction all remaining teeth  CC: Pain off and on for years.   Past Medical History:  High Blood Pressure, HIV, Late stage Syphilis,  Polysubstance abuse, ADHD    Medications: Biktarvy    Allergies:     Morphine , Amoxicillin    Surgeries:   Oral Surgery     Social History       Smoking:            Alcohol: Drug use:                             Exam: BMI 28. Gross decay and residual roots teeth # 2-15, 18, 20 - 31.  No purulence, edema, fluctuance, trismus. Oral cancer screening negative. Pharynx clear. No lymphadenopathy.  Panorex:Gross decay and residual roots teeth # 2-15, 18, 20 - 31.  Assessment: ASA 2. Non-restorable  teeth # 2-15, 18, 20 - 31.             Plan: Extraction Teeth # 2, 3, 4, 5, 6, 7, 8, 9, 10, 11, 12, 13, 14, 15, 18, 20, 21, 22, 23, 24, 25, 26, 27, 28, 29, 30, 31, Alveoloplasty.   Hospital Day surgery.                 Rx: n               Risks and complications explained. Questions answered.   Glendia EMERSON Primrose, DMD

## 2023-11-10 ENCOUNTER — Ambulatory Visit (HOSPITAL_BASED_OUTPATIENT_CLINIC_OR_DEPARTMENT_OTHER): Admission: RE | Admit: 2023-11-10 | Source: Home / Self Care | Admitting: Oral Surgery

## 2023-11-10 ENCOUNTER — Encounter (HOSPITAL_BASED_OUTPATIENT_CLINIC_OR_DEPARTMENT_OTHER): Payer: Self-pay | Admitting: Anesthesiology

## 2023-11-10 HISTORY — DX: Late syphilis, unspecified: A52.9

## 2023-11-10 HISTORY — DX: Other psychoactive substance abuse, uncomplicated: F19.10

## 2023-11-10 HISTORY — DX: Other complications of anesthesia, initial encounter: T88.59XA

## 2023-11-10 SURGERY — DENTAL RESTORATION/EXTRACTIONS
Anesthesia: General

## 2023-11-10 NOTE — Anesthesia Preprocedure Evaluation (Signed)
 Anesthesia Evaluation  Patient identified by MRN, date of birth, ID band Patient awake    Reviewed: Allergy & Precautions, NPO status , Patient's Chart, lab work & pertinent test results  History of Anesthesia Complications (+) history of anesthetic complications ([place])  Airway        Dental   Pulmonary Current Smoker          Cardiovascular   HLD   Neuro/Psych  PSYCHIATRIC DISORDERS (ADHD) Anxiety Depression       GI/Hepatic ,,,(+)     substance abuse    Endo/Other    Renal/GU      Musculoskeletal   Abdominal   Peds  Hematology  (+) HIV (on Biktarvy)  Anesthesia Other Findings On Suboxone  Reproductive/Obstetrics                              Anesthesia Physical Anesthesia Plan  ASA: 2  Anesthesia Plan: General   Post-op Pain Management: Tylenol  PO (pre-op)*   Induction: Intravenous  PONV Risk Score and Plan: 1 and Ondansetron , Dexamethasone and Treatment may vary due to age or medical condition  Airway Management Planned: Nasal ETT  Additional Equipment:   Intra-op Plan:   Post-operative Plan: Extubation in OR  Informed Consent:      Dental advisory given  Plan Discussed with: CRNA and Anesthesiologist  Anesthesia Plan Comments: (Risks of general anesthesia discussed including, but not limited to, sore throat, hoarse voice, chipped/damaged teeth, injury to vocal cords, nausea and vomiting, allergic reactions, lung infection, heart attack, stroke, and death. All questions answered. )         Anesthesia Quick Evaluation

## 2024-05-21 ENCOUNTER — Ambulatory Visit: Payer: Self-pay

## 2024-05-21 ENCOUNTER — Telehealth: Payer: Self-pay

## 2024-05-21 ENCOUNTER — Ambulatory Visit: Payer: Self-pay | Admitting: Internal Medicine
